# Patient Record
Sex: Female | Born: 1960 | Race: White | Hispanic: No | Marital: Single | Smoking: Former smoker
Health system: Southern US, Community
[De-identification: ages and names within clinical notes are randomized; demographics above are authoritative.]

## PROBLEM LIST (undated history)

## (undated) DIAGNOSIS — T7840XA Allergy, unspecified, initial encounter: Secondary | ICD-10-CM

## (undated) DIAGNOSIS — B1920 Unspecified viral hepatitis C without hepatic coma: Secondary | ICD-10-CM

## (undated) DIAGNOSIS — K219 Gastro-esophageal reflux disease without esophagitis: Secondary | ICD-10-CM

## (undated) DIAGNOSIS — M1990 Unspecified osteoarthritis, unspecified site: Secondary | ICD-10-CM

## (undated) DIAGNOSIS — J449 Chronic obstructive pulmonary disease, unspecified: Secondary | ICD-10-CM

## (undated) HISTORY — PX: BACK SURGERY: SHX140

## (undated) HISTORY — DX: Gastro-esophageal reflux disease without esophagitis: K21.9

## (undated) HISTORY — PX: SPINE SURGERY: SHX786

## (undated) HISTORY — PX: ABDOMINAL HYSTERECTOMY: SHX81

## (undated) HISTORY — DX: Unspecified osteoarthritis, unspecified site: M19.90

## (undated) HISTORY — DX: Allergy, unspecified, initial encounter: T78.40XA

---

## 2010-07-13 ENCOUNTER — Emergency Department: Payer: Self-pay | Admitting: Unknown Physician Specialty

## 2013-08-30 ENCOUNTER — Ambulatory Visit: Payer: Self-pay | Admitting: Urology

## 2013-09-11 ENCOUNTER — Ambulatory Visit: Payer: Self-pay | Admitting: Obstetrics and Gynecology

## 2013-09-12 ENCOUNTER — Ambulatory Visit: Payer: Self-pay | Admitting: Urology

## 2013-11-05 ENCOUNTER — Ambulatory Visit: Payer: Self-pay | Admitting: Gastroenterology

## 2013-11-06 LAB — PATHOLOGY REPORT

## 2016-01-26 ENCOUNTER — Other Ambulatory Visit: Payer: Self-pay | Admitting: Physician Assistant

## 2016-01-26 ENCOUNTER — Ambulatory Visit
Admission: RE | Admit: 2016-01-26 | Discharge: 2016-01-26 | Disposition: A | Discharge status: Home / Self Care | Payer: BLUE CROSS/BLUE SHIELD | Source: Ambulatory Visit | Attending: Family Medicine | Admitting: Family Medicine

## 2016-01-26 ENCOUNTER — Other Ambulatory Visit: Payer: Self-pay | Admitting: Family Medicine

## 2016-01-26 DIAGNOSIS — K5730 Diverticulosis of large intestine without perforation or abscess without bleeding: Secondary | ICD-10-CM | POA: Diagnosis not present

## 2016-01-26 DIAGNOSIS — M48061 Spinal stenosis, lumbar region without neurogenic claudication: Secondary | ICD-10-CM | POA: Diagnosis not present

## 2016-01-26 DIAGNOSIS — M545 Low back pain: Secondary | ICD-10-CM

## 2016-01-26 DIAGNOSIS — R102 Pelvic and perineal pain: Secondary | ICD-10-CM | POA: Insufficient documentation

## 2016-01-26 DIAGNOSIS — M5126 Other intervertebral disc displacement, lumbar region: Secondary | ICD-10-CM | POA: Insufficient documentation

## 2016-01-26 DIAGNOSIS — M5136 Other intervertebral disc degeneration, lumbar region: Secondary | ICD-10-CM | POA: Insufficient documentation

## 2016-01-28 ENCOUNTER — Other Ambulatory Visit: Payer: Self-pay | Admitting: Specialist

## 2016-01-28 DIAGNOSIS — R911 Solitary pulmonary nodule: Secondary | ICD-10-CM

## 2016-01-28 DIAGNOSIS — R0602 Shortness of breath: Secondary | ICD-10-CM

## 2016-02-09 ENCOUNTER — Encounter
Admission: RE | Admit: 2016-02-09 | Discharge: 2016-02-09 | Disposition: A | Discharge status: Home / Self Care | Payer: BLUE CROSS/BLUE SHIELD | Source: Ambulatory Visit | Attending: Specialist | Admitting: Specialist

## 2016-02-09 DIAGNOSIS — R911 Solitary pulmonary nodule: Secondary | ICD-10-CM | POA: Diagnosis present

## 2016-02-09 DIAGNOSIS — R0602 Shortness of breath: Secondary | ICD-10-CM

## 2016-02-09 LAB — GLUCOSE, CAPILLARY: Glucose-Capillary: 89 mg/dL (ref 65–99)

## 2016-02-09 MED ORDER — FLUDEOXYGLUCOSE F - 18 (FDG) INJECTION
12.9300 | Freq: Once | INTRAVENOUS | Status: AC | PRN
  Administered 2016-02-09: 12.93 via INTRAVENOUS

## 2017-08-08 ENCOUNTER — Emergency Department
Admission: EM | Admit: 2017-08-08 | Discharge: 2017-08-08 | Disposition: A | Discharge status: Home / Self Care | Payer: BLUE CROSS/BLUE SHIELD | Attending: Student in an Organized Health Care Education/Training Program | Admitting: Student in an Organized Health Care Education/Training Program

## 2017-08-08 ENCOUNTER — Encounter: Payer: Self-pay | Admitting: Emergency Medicine

## 2017-08-08 ENCOUNTER — Emergency Department: Payer: BLUE CROSS/BLUE SHIELD

## 2017-08-08 DIAGNOSIS — Z87891 Personal history of nicotine dependence: Secondary | ICD-10-CM | POA: Diagnosis not present

## 2017-08-08 DIAGNOSIS — Y9389 Activity, other specified: Secondary | ICD-10-CM | POA: Diagnosis not present

## 2017-08-08 DIAGNOSIS — R519 Headache, unspecified: Secondary | ICD-10-CM

## 2017-08-08 DIAGNOSIS — Y92017 Garden or yard in single-family (private) house as the place of occurrence of the external cause: Secondary | ICD-10-CM | POA: Diagnosis not present

## 2017-08-08 DIAGNOSIS — S20369A Insect bite (nonvenomous) of unspecified front wall of thorax, initial encounter: Secondary | ICD-10-CM | POA: Insufficient documentation

## 2017-08-08 DIAGNOSIS — R51 Headache: Secondary | ICD-10-CM | POA: Diagnosis not present

## 2017-08-08 DIAGNOSIS — W57XXXA Bitten or stung by nonvenomous insect and other nonvenomous arthropods, initial encounter: Secondary | ICD-10-CM | POA: Insufficient documentation

## 2017-08-08 DIAGNOSIS — Y998 Other external cause status: Secondary | ICD-10-CM | POA: Diagnosis not present

## 2017-08-08 LAB — BASIC METABOLIC PANEL
ANION GAP: 8 (ref 5–15)
BUN: 15 mg/dL (ref 6–20)
CHLORIDE: 106 mmol/L (ref 101–111)
CO2: 25 mmol/L (ref 22–32)
Calcium: 8.9 mg/dL (ref 8.9–10.3)
Creatinine, Ser: 0.4 mg/dL — ABNORMAL LOW (ref 0.44–1.00)
GFR calc Af Amer: >60 mL/min (ref >60)
Glucose, Bld: 100 mg/dL — ABNORMAL HIGH (ref 65–99)
POTASSIUM: 3.7 mmol/L (ref 3.5–5.1)
Sodium: 139 mmol/L (ref 135–145)

## 2017-08-08 LAB — CBC
HEMATOCRIT: 42.2 % (ref 35.0–47.0)
HEMOGLOBIN: 14.6 g/dL (ref 12.0–16.0)
MCH: 31.8 pg (ref 26.0–34.0)
MCHC: 34.6 g/dL (ref 32.0–36.0)
MCV: 92 fL (ref 80.0–100.0)
Platelets: 242 10*3/uL (ref 150–440)
RBC: 4.59 MIL/uL (ref 3.80–5.20)
RDW: 13.9 % (ref 11.5–14.5)
WBC: 8.5 10*3/uL (ref 3.6–11.0)

## 2017-08-08 LAB — URINALYSIS, COMPLETE (UACMP) WITH MICROSCOPIC
Bilirubin Urine: NEGATIVE
Glucose, UA: NEGATIVE mg/dL
KETONES UR: NEGATIVE mg/dL
LEUKOCYTES UA: NEGATIVE
NITRITE: NEGATIVE
PH: 5 (ref 5.0–8.0)
Protein, ur: NEGATIVE mg/dL
SPECIFIC GRAVITY, URINE: 1.019 (ref 1.005–1.030)

## 2017-08-08 MED ORDER — PROCHLORPERAZINE EDISYLATE 10 MG/2ML IJ SOLN
10.0000 mg | Freq: Once | INTRAMUSCULAR | Status: AC
  Administered 2017-08-08: 10 mg via INTRAVENOUS
  Filled 2017-08-08: qty 2

## 2017-08-08 MED ORDER — DEXAMETHASONE SODIUM PHOSPHATE 10 MG/ML IJ SOLN
10.0000 mg | Freq: Once | INTRAMUSCULAR | Status: AC
  Administered 2017-08-08: 10 mg via INTRAVENOUS
  Filled 2017-08-08: qty 1

## 2017-08-08 MED ORDER — ACETAMINOPHEN 500 MG PO TABS
1000.0000 mg | ORAL_TABLET | Freq: Once | ORAL | Status: AC
  Administered 2017-08-08: 1000 mg via ORAL
  Filled 2017-08-08: qty 2

## 2017-08-08 NOTE — ED Provider Notes (Signed)
[facility] Emergency Department Provider Note    First MD Initiated Contact with Patient 08/08/17 1501     (approximate)  I have reviewed the triage vital signs and the nursing notes.   HISTORY  Chief Complaint Headache    HPI Crystal Villa is a 57 y.o. female with a history of headaches presents to the ER for evaluation of headache that she rates as 8 out of 10 in severity since Saturday night.  States that the headache started after the patient was out in the yard gardening and then got bit by some form of bug on her upper abdomen with several itching bites.  States that the itching has been driving her crazy and causing her head to feel like it is about to pop off of her shoulders.  Denies any numbness or tingling.  States that it is harder for her to focus on things that are far away.  Denies any visual field cuts.  No nausea or vomiting.  No fevers.  No neck pain.  Has not tried any Benadryl but did take a BC Goody's powder this morning.  History reviewed. No pertinent past medical history. No family history on file.  There are no active problems to display for this patient.     Prior to Admission medications   Not on File    Allergies Patient has no known allergies.    Social History Social History   Tobacco Use  . Smoking status: Former Smoker  Substance Use Topics  . Alcohol use: Not on file  . Drug use: Not on file    Review of Systems Patient denies headaches, rhinorrhea, blurry vision, numbness, shortness of breath, chest pain, edema, cough, abdominal pain, nausea, vomiting, diarrhea, dysuria, fevers, rashes or hallucinations unless otherwise stated above in HPI. ____________________________________________   PHYSICAL EXAM:  VITAL SIGNS: Vitals:   08/08/17 1221 08/08/17 1523  BP: (!) 137/97 133/73  Pulse: 76 72  Resp: 17 18  Temp: 97.8 F (36.6 C) 97.6 F (36.4 C)  SpO2: 97% 100%    Constitutional: Alert  and oriented. Well appearing and in no acute distress. Eyes: Conjunctivae are normal.  Head: Atraumatic. Nose: No congestion/rhinnorhea. Mouth/Throat: Mucous membranes are moist.   Neck: No stridor. Painless ROM.  Cardiovascular: Normal rate, regular rhythm. Grossly normal heart sounds.  Good peripheral circulation. Respiratory: Normal respiratory effort.  No retractions. Lungs CTAB. Gastrointestinal: Soft and nontender. No distention. No abdominal bruits. No CVA tenderness. Genitourinary:  Musculoskeletal: No lower extremity tenderness nor edema.  No joint effusions. Neurologic:  CN- intact.  No facial droop, Normal FNF.  Normal heel to shin.  Sensation intact bilaterally. Normal speech and language. No gross focal neurologic deficits are appreciated. No gait instability. Skin:  Skin is warm, dry and intact.3 bug bits just uner bra line, no surrounding erythema or purulence Psychiatric: Mood and affect are normal. Speech and behavior are normal.  ____________________________________________   LABS (all labs ordered are listed, but only abnormal results are displayed)  Results for orders placed or performed during the hospital encounter of 08/08/17 (from the past 24 hour(s))  Basic metabolic panel     Status: Abnormal   Collection Time: 08/08/17 12:39 PM  Result Value Ref Range   Sodium 139 135 - 145 mmol/L   Potassium 3.7 3.5 - 5.1 mmol/L   Chloride 106 101 - 111 mmol/L   CO2 25 22 - 32 mmol/L   Glucose, Bld 100 (H) 65 - 99 mg/dL  BUN 15 6 - 20 mg/dL   Creatinine, Ser 7.820.40 (L) 0.44 - 1.00 mg/dL   Calcium 8.9 8.9 - 95.610.3 mg/dL   GFR calc non Af Amer >60 >60 mL/min   GFR calc Af Amer >60 >60 mL/min   Anion gap 8 5 - 15  CBC     Status: None   Collection Time: 08/08/17 12:39 PM  Result Value Ref Range   WBC 8.5 3.6 - 11.0 K/uL   RBC 4.59 3.80 - 5.20 MIL/uL   Hemoglobin 14.6 12.0 - 16.0 g/dL   HCT 21.342.2 08.635.0 - 57.847.0 %   MCV 92.0 80.0 - 100.0 fL   MCH 31.8 26.0 - 34.0 pg   MCHC  34.6 32.0 - 36.0 g/dL   RDW 46.913.9 62.911.5 - 52.814.5 %   Platelets 242 150 - 440 K/uL  Urinalysis, Complete w Microscopic     Status: Abnormal   Collection Time: 08/08/17 12:39 PM  Result Value Ref Range   Color, Urine YELLOW (A) YELLOW   APPearance CLEAR (A) CLEAR   Specific Gravity, Urine 1.019 1.005 - 1.030   pH 5.0 5.0 - 8.0   Glucose, UA NEGATIVE NEGATIVE mg/dL   Hgb urine dipstick SMALL (A) NEGATIVE   Bilirubin Urine NEGATIVE NEGATIVE   Ketones, ur NEGATIVE NEGATIVE mg/dL   Protein, ur NEGATIVE NEGATIVE mg/dL   Nitrite NEGATIVE NEGATIVE   Leukocytes, UA NEGATIVE NEGATIVE   RBC / HPF 0-5 0 - 5 RBC/hpf   WBC, UA 0-5 0 - 5 WBC/hpf   Squamous Epithelial / LPF 0-5 (A) NONE SEEN   Mucus PRESENT    ____________________________________________  EKG My review and personal interpretation at Time: 12:28   Indication: headache  Rate: 70  Rhythm: sinus Axis: normal Other: normal intervals, no stemio ____________________________________________  RADIOLOGY  I personally reviewed all radiographic images ordered to evaluate for the above acute complaints and reviewed radiology reports and findings.  These findings were personally discussed with the patient.  Please see medical record for radiology report.  ____________________________________________   PROCEDURES  Procedure(s) performed:  Procedures    Critical Care performed: no ____________________________________________   INITIAL IMPRESSION / ASSESSMENT AND PLAN / ED COURSE  Pertinent labs & imaging results that were available during my care of the patient were reviewed by me and considered in my medical decision making (see chart for details).  DDX: tension, migraine, cluster, unlikely sah, iph, gca  Crystal Villa is a 57 y.o. who presents to the ED with symptoms as described above.  No focal neuro deficits.  CT head read triage shows no evidence of mass or bleed.  This not clinically consistent with subarachnoid.   She has no evidence of meningeal irritation and symptoms seem mild.  Symptoms seem predominantly exacerbated by the itching in her chest therefore I do suspect some component of tension headache.  Not clinically consistent with cluster.  Blood work otherwise reassuring.  Will give IV migraine cocktail and reassess.  Clinical Course as of Aug 09 1627  Mon Aug 08, 2017  1626 Patient reassessed and is currently pain-free.  Itching has resolved.  This point patient stable and appropriate for outpatient follow-up.   [PR]    Clinical Course User Index [PR] Willy Eddyobinson, Vasil Juhasz, MD     As part of my medical decision making, I reviewed the following data within the electronic MEDICAL RECORD NUMBER Nursing notes reviewed and incorporated, Labs reviewed, notes from prior ED visits and [place] Controlled Substance Database   ____________________________________________  FINAL CLINICAL IMPRESSION(S) / ED DIAGNOSES  Final diagnoses:  Bad headache  Insect bite of front wall of thorax, unspecified laterality, initial encounter      NEW MEDICATIONS STARTED DURING THIS VISIT:  New Prescriptions   No medications on file     Note:  This document was prepared using Dragon voice recognition software and may include unintentional dictation errors.    Willy Eddy, MD 08/08/17 [phone]

## 2017-08-08 NOTE — Discharge Instructions (Addendum)

## 2017-08-08 NOTE — ED Triage Notes (Signed)
[facility]/o headache 8/10 pain since Saturday night. Reports vision disturbances, things out of focus and feeling anxious. Also reports bug bites to upper abdomen that are itchy.

## 2019-01-23 ENCOUNTER — Other Ambulatory Visit: Payer: Self-pay | Admitting: *Deleted

## 2019-01-23 DIAGNOSIS — Z20822 Contact with and (suspected) exposure to covid-19: Secondary | ICD-10-CM

## 2019-01-25 LAB — NOVEL CORONAVIRUS, NAA: SARS-CoV-2, NAA: NOT DETECTED

## 2019-10-02 ENCOUNTER — Ambulatory Visit
Admission: EM | Admit: 2019-10-02 | Discharge: 2019-10-02 | Disposition: A | Discharge status: Home / Self Care | Payer: BC Managed Care – PPO | Attending: Emergency Medicine | Admitting: Emergency Medicine

## 2019-10-02 ENCOUNTER — Encounter: Payer: Self-pay | Admitting: Emergency Medicine

## 2019-10-02 DIAGNOSIS — R05 Cough: Secondary | ICD-10-CM

## 2019-10-02 DIAGNOSIS — R059 Cough, unspecified: Secondary | ICD-10-CM

## 2019-10-02 DIAGNOSIS — Z1152 Encounter for screening for COVID-19: Secondary | ICD-10-CM

## 2019-10-02 HISTORY — DX: Chronic obstructive pulmonary disease, unspecified: J44.9

## 2019-10-02 MED ORDER — FLUTICASONE PROPIONATE 50 MCG/ACT NA SUSP: 1.0000 | Freq: Every day | NASAL | 0 refills | Status: DC

## 2019-10-02 MED ORDER — BENZONATATE 100 MG PO CAPS: 100.0000 mg | ORAL_CAPSULE | Freq: Three times a day (TID) | ORAL | 0 refills | Status: DC

## 2019-10-02 MED ORDER — PREDNISONE 10 MG PO TABS: 20.0000 mg | ORAL_TABLET | Freq: Every day | ORAL | 0 refills | Status: DC

## 2019-10-02 MED ORDER — CETIRIZINE HCL 10 MG PO TABS: 10.0000 mg | ORAL_TABLET | Freq: Every day | ORAL | 0 refills | Status: DC

## 2019-10-02 NOTE — ED Triage Notes (Addendum)
Sinus pressure, headache, congestion and cough since yesterday.  Needs a work note, was unable to go in today.

## 2019-10-02 NOTE — ED Provider Notes (Signed)
[facility] CARE CENTER   [id number] 10/02/19 Arrival Time: 0810   Chief Complaint  Patient presents with  . Cough    SUBJECTIVE: History from: patient.  Crystal Villa is a 59 y.o. female with history of COPD presents to the urgent care with a complaint of sinus pressure, congestion, cough and headache for the past 1 day.  Denies sick exposure to COVID, flu or strep.  Denies recent travel.  Has tried  albuterol without relief.  Symptoms are made worse in the morning.  Denies previous symptoms in the past.   Denies fever, chills, fatigue, sinus pain, rhinorrhea, sore throat, SOB, wheezing, chest pain, nausea, changes in bowel or bladder habits.        Received flu shot this year: no.  ROS: As per HPI.  All other pertinent ROS negative.     Past Medical History:  Diagnosis Date  . COPD (chronic obstructive pulmonary disease) (HCC)    Past Surgical History:  Procedure Laterality Date  . ABDOMINAL HYSTERECTOMY    . BACK SURGERY     No Known Allergies No current facility-administered medications on file prior to encounter.   No current outpatient medications on file prior to encounter.   Social History   Socioeconomic History  . Marital status: Single    Spouse name: Not on file  . Number of children: Not on file  . Years of education: Not on file  . Highest education level: Not on file  Occupational History  . Not on file  Tobacco Use  . Smoking status: Former Games developer  . Smokeless tobacco: Never Used  Substance and Sexual Activity  . Alcohol use: Never  . Drug use: Never  . Sexual activity: Not on file  Other Topics Concern  . Not on file  Social History Narrative  . Not on file   Social Determinants of Health   Financial Resource Strain:   . Difficulty of Paying Living Expenses:   Food Insecurity:   . Worried About Programme researcher, broadcasting/film/video in the Last Year:   . Barista in the Last Year:   Transportation Needs:   . Freight forwarder  (Medical):   Marland Kitchen Lack of Transportation (Non-Medical):   Physical Activity:   . Days of Exercise per Week:   . Minutes of Exercise per Session:   Stress:   . Feeling of Stress :   Social Connections:   . Frequency of Communication with Friends and Family:   . Frequency of Social Gatherings with Friends and Family:   . Attends Religious Services:   . Active Member of Clubs or Organizations:   . Attends Banker Meetings:   Marland Kitchen Marital Status:   Intimate Partner Violence:   . Fear of Current or Ex-Partner:   . Emotionally Abused:   Marland Kitchen Physically Abused:   . Sexually Abused:    Family History  Problem Relation Age of Onset  . Healthy Mother   . Hyperlipidemia Father     OBJECTIVE:  Vitals:   10/02/19 0821  BP: 122/80  Pulse: 93  Resp: 18  Temp: 98.3 F (36.8 C)  TempSrc: Oral  SpO2: 98%  Weight: 150 lb (68 kg)  Height: 5\' 3"  (1.6 m)     Physical Exam Vitals and nursing note reviewed.  Constitutional:      General: She is not in acute distress.    Appearance: Normal appearance. She is normal weight. She is not ill-appearing, toxic-appearing or diaphoretic.  HENT:  Head: Normocephalic.     Right Ear: Tympanic membrane, ear canal and external ear normal. There is no impacted cerumen.     Left Ear: Tympanic membrane, ear canal and external ear normal. There is no impacted cerumen.     Nose: Nose normal. No congestion or rhinorrhea.     Mouth/Throat:     Mouth: Mucous membranes are moist.     Pharynx: Oropharynx is clear. No oropharyngeal exudate or posterior oropharyngeal erythema.  Cardiovascular:     Rate and Rhythm: Normal rate and regular rhythm.     Pulses: Normal pulses.     Heart sounds: Normal heart sounds. No murmur heard.  No friction rub. No gallop.   Pulmonary:     Effort: Pulmonary effort is normal. No respiratory distress.     Breath sounds: Normal breath sounds. No stridor. No wheezing, rhonchi or rales.  Chest:     Chest wall: No  tenderness.  Skin:    Capillary Refill: Capillary refill takes less than 2 seconds.  Neurological:     General: No focal deficit present.     Mental Status: She is alert and oriented to person, place, and time.     LABS:  No results found for this or any previous visit (from the past 24 hour(s)).   ASSESSMENT & PLAN:  1. Cough   2. Encounter for screening for COVID-19     Meds ordered this encounter  Medications  . benzonatate (TESSALON) 100 MG capsule    Sig: Take 1 capsule (100 mg total) by mouth every 8 (eight) hours.    Dispense:  30 capsule    Refill:  0  . fluticasone (FLONASE) 50 MCG/ACT nasal spray    Sig: Place 1 spray into both nostrils daily for 14 days.    Dispense:  16 g    Refill:  0  . cetirizine (ZYRTEC ALLERGY) 10 MG tablet    Sig: Take 1 tablet (10 mg total) by mouth daily.    Dispense:  30 tablet    Refill:  0  . predniSONE (DELTASONE) 10 MG tablet    Sig: Take 2 tablets (20 mg total) by mouth daily.    Dispense:  15 tablet    Refill:  0   Discharge instructions    COVID testing ordered.  It will take between 2-7 days for test results.  Someone will contact you regarding abnormal results.    In the meantime: You should remain isolated in your home for 10 days from symptom onset AND greater than 24 hours after symptoms resolution (absence of fever without the use of fever-reducing medication and improvement in respiratory symptoms), whichever is longer Get plenty of rest and push fluids Tessalon Perles prescribed for cough  zyrtec for nasal congestion, runny nose, and/or sore throat  flonase for nasal congestion and runny nose Prednisone was prescribed Use medications daily for symptom relief Use OTC medications like ibuprofen or tylenol as needed fever or pain Call or go to the ED if you have any new or worsening symptoms such as fever, worsening cough, shortness of breath, chest tightness, chest pain, turning blue, changes in mental status,  etc...   Reviewed expectations re: course of current medical issues. Questions answered. Outlined signs and symptoms indicating need for more acute intervention. Patient verbalized understanding. After Visit Summary given.     Note: This document was prepared using Dragon voice recognition software and may include unintentional dictation errors.       Pilar Grammes  S, FNP 10/02/19 [phone]

## 2019-10-02 NOTE — Discharge Instructions (Addendum)
COVID testing ordered.  It will take between 2-7 days for test results.  Someone will contact you regarding abnormal results.    In the meantime: You should remain isolated in your home for 10 days from symptom onset AND greater than 24 hours after symptoms resolution (absence of fever without the use of fever-reducing medication and improvement in respiratory symptoms), whichever is longer Get plenty of rest and push fluids Tessalon Perles prescribed for cough  zyrtec for nasal congestion, runny nose, and/or sore throat  flonase for nasal congestion and runny nose Prednisone was prescribed Use medications daily for symptom relief Use OTC medications like ibuprofen or tylenol as needed fever or pain Call or go to the ED if you have any new or worsening symptoms such as fever, worsening cough, shortness of breath, chest tightness, chest pain, turning blue, changes in mental status, etc..Marland Kitchen

## 2019-10-03 LAB — SARS-COV-2, NAA 2 DAY TAT

## 2019-10-03 LAB — NOVEL CORONAVIRUS, NAA: SARS-CoV-2, NAA: NOT DETECTED

## 2020-01-03 ENCOUNTER — Ambulatory Visit
Admission: EM | Admit: 2020-01-03 | Discharge: 2020-01-03 | Disposition: A | Discharge status: Home / Self Care | Payer: Self-pay | Attending: Emergency Medicine | Admitting: Emergency Medicine

## 2020-01-03 ENCOUNTER — Encounter: Payer: Self-pay | Admitting: Emergency Medicine

## 2020-01-03 ENCOUNTER — Other Ambulatory Visit: Payer: Self-pay

## 2020-01-03 DIAGNOSIS — Z1152 Encounter for screening for COVID-19: Secondary | ICD-10-CM

## 2020-01-03 DIAGNOSIS — J069 Acute upper respiratory infection, unspecified: Secondary | ICD-10-CM

## 2020-01-03 DIAGNOSIS — A084 Viral intestinal infection, unspecified: Secondary | ICD-10-CM

## 2020-01-03 MED ORDER — BENZONATATE 100 MG PO CAPS: 100.0000 mg | ORAL_CAPSULE | Freq: Three times a day (TID) | ORAL | 0 refills | Status: DC

## 2020-01-03 MED ORDER — DEXAMETHASONE 4 MG PO TABS: 4.0000 mg | ORAL_TABLET | Freq: Two times a day (BID) | ORAL | 0 refills | Status: AC

## 2020-01-03 MED ORDER — ONDANSETRON 4 MG PO TBDP
4.0000 mg | ORAL_TABLET | Freq: Once | ORAL | Status: AC
  Administered 2020-01-03: 4 mg via ORAL

## 2020-01-03 MED ORDER — ONDANSETRON 4 MG PO TBDP: 4.0000 mg | ORAL_TABLET | Freq: Three times a day (TID) | ORAL | 0 refills | Status: DC | PRN

## 2020-01-03 MED ORDER — FLUTICASONE PROPIONATE 50 MCG/ACT NA SUSP: 1.0000 | Freq: Every day | NASAL | 0 refills | Status: DC

## 2020-01-03 NOTE — ED Provider Notes (Signed)
[facility] CARE CENTER   [id number] 01/03/20 Arrival Time: 0807   CC: COVID symptoms  SUBJECTIVE: History from: patient.  Crystal Villa is a 59 y.o. female who presents the urgent care for complaint of fatigue, cough, nausea, vomiting and diarrhea started this past Monday.  Denies sick exposure to COVID, flu or strep.  Denies recent travel.  Has tried OTC medication with no relief.  Denies aggravating factors.  Denies previous symptoms in the past.   Denies fever, chills, fatigue, sinus pain, rhinorrhea, sore throat, SOB, wheezing, chest pain, changes in bowel or bladder habits.     ROS: As per HPI.  All other pertinent ROS negative.      Past Medical History:  Diagnosis Date  . COPD (chronic obstructive pulmonary disease) (HCC)    Past Surgical History:  Procedure Laterality Date  . ABDOMINAL HYSTERECTOMY    . BACK SURGERY     No Known Allergies No current facility-administered medications on file prior to encounter.   Current Outpatient Medications on File Prior to Encounter  Medication Sig Dispense Refill  . cetirizine (ZYRTEC ALLERGY) 10 MG tablet Take 1 tablet (10 mg total) by mouth daily. 30 tablet 0  . predniSONE (DELTASONE) 10 MG tablet Take 2 tablets (20 mg total) by mouth daily. 15 tablet 0   Social History   Socioeconomic History  . Marital status: Single    Spouse name: Not on file  . Number of children: Not on file  . Years of education: Not on file  . Highest education level: Not on file  Occupational History  . Not on file  Tobacco Use  . Smoking status: Former Games developer  . Smokeless tobacco: Never Used  Substance and Sexual Activity  . Alcohol use: Never  . Drug use: Never  . Sexual activity: Not on file  Other Topics Concern  . Not on file  Social History Narrative  . Not on file   Social Determinants of Health   Financial Resource Strain:   . Difficulty of Paying Living Expenses: Not on file  Food Insecurity:   . Worried About  Programme researcher, broadcasting/film/video in the Last Year: Not on file  . Ran Out of Food in the Last Year: Not on file  Transportation Needs:   . Lack of Transportation (Medical): Not on file  . Lack of Transportation (Non-Medical): Not on file  Physical Activity:   . Days of Exercise per Week: Not on file  . Minutes of Exercise per Session: Not on file  Stress:   . Feeling of Stress : Not on file  Social Connections:   . Frequency of Communication with Friends and Family: Not on file  . Frequency of Social Gatherings with Friends and Family: Not on file  . Attends Religious Services: Not on file  . Active Member of Clubs or Organizations: Not on file  . Attends Banker Meetings: Not on file  . Marital Status: Not on file  Intimate Partner Violence:   . Fear of Current or Ex-Partner: Not on file  . Emotionally Abused: Not on file  . Physically Abused: Not on file  . Sexually Abused: Not on file   Family History  Problem Relation Age of Onset  . Healthy Mother   . Hyperlipidemia Father     OBJECTIVE:  Vitals:   01/03/20 0824  BP: (!) 129/91  Pulse: 83  Resp: 18  Temp: 98 F (36.7 C)  TempSrc: Oral  SpO2: 98%  Weight: 150  lb (68 kg)  Height: 5\' 4"  (1.626 m)     General appearance: alert; appears fatigued, but nontoxic; speaking in full sentences and tolerating own secretions HEENT: NCAT; Ears: EACs clear, TMs pearly gray; Eyes: PERRL.  EOM grossly intact. Sinuses: nontender; Nose: nares patent without rhinorrhea, Throat: oropharynx clear, tonsils non erythematous or enlarged, uvula midline  Neck: supple without LAD Lungs: unlabored respirations, symmetrical air entry; cough: moderate; no respiratory distress; CTAB Heart: regular rate and rhythm.  Radial pulses 2+ symmetrical bilaterally Skin: warm and dry Psychological: alert and cooperative; normal mood and affect  LABS:  No results found for this or any previous visit (from the past 24 hour(s)).   ASSESSMENT &  PLAN:  1. Viral URI   2. Viral gastroenteritis   3. Encounter for screening for COVID-19     Meds ordered this encounter  Medications  . ondansetron (ZOFRAN-ODT) disintegrating tablet 4 mg  . ondansetron (ZOFRAN ODT) 4 MG disintegrating tablet    Sig: Take 1 tablet (4 mg total) by mouth every 8 (eight) hours as needed for nausea or vomiting.    Dispense:  20 tablet    Refill:  0  . benzonatate (TESSALON) 100 MG capsule    Sig: Take 1 capsule (100 mg total) by mouth every 8 (eight) hours.    Dispense:  30 capsule    Refill:  0  . fluticasone (FLONASE) 50 MCG/ACT nasal spray    Sig: Place 1 spray into both nostrils daily for 14 days.    Dispense:  16 g    Refill:  0  . dexamethasone (DECADRON) 4 MG tablet    Sig: Take 1 tablet (4 mg total) by mouth 2 (two) times daily with a meal for 7 days.    Dispense:  14 tablet    Refill:  0     COVID testing ordered.  It will take between 2-7 days for test results.  Someone will contact you regarding abnormal results.    In the meantime: You should remain isolated in your home for 10 days from symptom onset AND greater than 24 hours after symptoms resolution (absence of fever without the use of fever-reducing medication and improvement in respiratory symptoms), whichever is longer Get plenty of rest and push fluids Tessalon Perles prescribed for cough Zofran prescribed for nausea Flonase for nasal congestion and runny nose Decadron was prescribed Use medications daily for symptom relief Use OTC medications like ibuprofen or tylenol as needed fever or pain Call or go to the ED if you have any new or worsening symptoms such as fever, worsening cough, shortness of breath, chest tightness, chest pain, turning blue, changes in mental status, etc...   Reviewed expectations re: course of current medical issues. Questions answered. Outlined signs and symptoms indicating need for more acute intervention. Patient verbalized understanding. After  Visit Summary given.         , FNP 01/03/20 [phone]

## 2020-01-03 NOTE — Discharge Instructions (Signed)
COVID testing ordered.  It will take between 2-7 days for test results.  Someone will contact you regarding abnormal results.    In the meantime: You should remain isolated in your home for 10 days from symptom onset AND greater than 24 hours after symptoms resolution (absence of fever without the use of fever-reducing medication and improvement in respiratory symptoms), whichever is longer Get plenty of rest and push fluids Tessalon Perles prescribed for cough Zofran prescribed for nausea Flonase for nasal congestion and runny nose Decadron was prescribed Use medications daily for symptom relief Use OTC medications like ibuprofen or tylenol as needed fever or pain Call or go to the ED if you have any new or worsening symptoms such as fever, worsening cough, shortness of breath, chest tightness, chest pain, turning blue, changes in mental status, etc..Marland Kitchen

## 2020-01-03 NOTE — ED Triage Notes (Signed)
Cough, N/V/D and fatigue since Monday

## 2020-01-05 LAB — NOVEL CORONAVIRUS, NAA: SARS-CoV-2, NAA: DETECTED — AB

## 2020-01-05 LAB — SARS-COV-2, NAA 2 DAY TAT

## 2020-01-14 ENCOUNTER — Encounter: Payer: Self-pay | Admitting: Emergency Medicine

## 2020-01-14 ENCOUNTER — Ambulatory Visit
Admission: EM | Admit: 2020-01-14 | Discharge: 2020-01-14 | Disposition: A | Discharge status: Home / Self Care | Payer: HRSA Program | Attending: Emergency Medicine | Admitting: Emergency Medicine

## 2020-01-14 DIAGNOSIS — B3781 Candidal esophagitis: Secondary | ICD-10-CM

## 2020-01-14 DIAGNOSIS — U071 COVID-19: Secondary | ICD-10-CM

## 2020-01-14 DIAGNOSIS — R070 Pain in throat: Secondary | ICD-10-CM

## 2020-01-14 MED ORDER — NYSTATIN 100000 UNIT/ML MT SUSP: 500000.0000 [IU] | Freq: Four times a day (QID) | OROMUCOSAL | 0 refills | Status: DC

## 2020-01-14 NOTE — ED Provider Notes (Signed)
[facility]   [id number] 01/14/20 Arrival Time: 1217   CC: COVID symptoms  SUBJECTIVE: History from: patient.  Crystal Villa is a 59 y.o. female who presents for persistent fatigued and "not feeling good" after covid diagnosis on 01/03/20.  Also report throat irritation x 1 day.  Treated with prednisone for covid infection.  Has tried OTC medications without relief.  Symptoms are made worse with swallowing, but tolerating own secretions without difficulty.  Denies previous symptoms in the past.   Denies fever, chills, sinus pain, rhinorrhea, SOB, wheezing, chest pain, nausea, changes in bowel or bladder habits.    ROS: As per HPI.  All other pertinent ROS negative.     Past Medical History:  Diagnosis Date  . COPD (chronic obstructive pulmonary disease) (HCC)    Past Surgical History:  Procedure Laterality Date  . ABDOMINAL HYSTERECTOMY    . BACK SURGERY     No Known Allergies No current facility-administered medications on file prior to encounter.   Current Outpatient Medications on File Prior to Encounter  Medication Sig Dispense Refill  . [DISCONTINUED] cetirizine (ZYRTEC ALLERGY) 10 MG tablet Take 1 tablet (10 mg total) by mouth daily. 30 tablet 0  . [DISCONTINUED] fluticasone (FLONASE) 50 MCG/ACT nasal spray Place 1 spray into both nostrils daily for 14 days. 16 g 0   Social History   Socioeconomic History  . Marital status: Single    Spouse name: Not on file  . Number of children: Not on file  . Years of education: Not on file  . Highest education level: Not on file  Occupational History  . Not on file  Tobacco Use  . Smoking status: Former Games developer  . Smokeless tobacco: Never Used  Substance and Sexual Activity  . Alcohol use: Never  . Drug use: Never  . Sexual activity: Not on file  Other Topics Concern  . Not on file  Social History Narrative  . Not on file   Social Determinants of Health   Financial Resource Strain:   . Difficulty  of Paying Living Expenses: Not on file  Food Insecurity:   . Worried About Programme researcher, broadcasting/film/video in the Last Year: Not on file  . Ran Out of Food in the Last Year: Not on file  Transportation Needs:   . Lack of Transportation (Medical): Not on file  . Lack of Transportation (Non-Medical): Not on file  Physical Activity:   . Days of Exercise per Week: Not on file  . Minutes of Exercise per Session: Not on file  Stress:   . Feeling of Stress : Not on file  Social Connections:   . Frequency of Communication with Friends and Family: Not on file  . Frequency of Social Gatherings with Friends and Family: Not on file  . Attends Religious Services: Not on file  . Active Member of Clubs or Organizations: Not on file  . Attends Banker Meetings: Not on file  . Marital Status: Not on file  Intimate Partner Violence:   . Fear of Current or Ex-Partner: Not on file  . Emotionally Abused: Not on file  . Physically Abused: Not on file  . Sexually Abused: Not on file   Family History  Problem Relation Age of Onset  . Healthy Mother   . Hyperlipidemia Father     OBJECTIVE:  Vitals:   01/14/20 1323  BP: 131/88  Pulse: 77  Resp: 17  Temp: 98.2 F (36.8 C)  TempSrc: Oral  SpO2: 97%  Weight: 150 lb (68 kg)  Height: 5\' 4"  (1.626 m)     General appearance: alert; appears fatigued, but nontoxic; speaking in full sentences and tolerating own secretions HEENT: NCAT; Ears: EACs clear, TMs pearly gray; Eyes: PERRL.  EOM grossly intact. Nose: nares patent without rhinorrhea, Throat: oropharynx erythematous, tonsils erythematous mildly enlarged, uvula erythematous, midline  Neck: supple without LAD Lungs: unlabored respirations, symmetrical air entry; cough: absent; no respiratory distress; CTAB Heart: regular rate and rhythm.   Skin: warm and dry Psychological: alert and cooperative; normal mood and affect  ASSESSMENT & PLAN:  1. COVID-19 virus infection   2. Throat pain   3.  Thrush of mouth and esophagus (HCC)     Meds ordered this encounter  Medications  . nystatin (MYCOSTATIN) 100000 UNIT/ML suspension    Sig: Take 5 mLs (500,000 Units total) by mouth 4 (four) times daily.    Dispense:  473 mL    Refill:  0    Order Specific Question:   Supervising Provider    Answer:   Eustace Moore    COVID test was positive Get plenty of rest and push fluids Continue to alternate ibuprofen and tylenol as needed for body aches and fatigue.   Some covid patients experience chronic fatigue syndrome follow covid infection.  Please follow up with post-covid care center for further evaluation and management of chronic covid symptoms Call or go to the ED if you have any new or worsening symptoms such as fever, worsening cough, shortness of breath, chest tightness, chest pain, turning blue, changes in mental status, etc...   Nystatin prescribed.  Use as directed for between 10-14 days or until symptoms resolve Maintain oral hygiene Follow up with PCP as needed Return or go to the ED if you have any new or worsening symptoms such as fever, chills, nausea, vomiting, difficulty swallowing, fatigue, lymph node swelling, unintentional weight loss, excessive night sweats, etc...   Reviewed expectations re: course of current medical issues. Questions answered. Outlined signs and symptoms indicating need for more acute intervention. Patient verbalized understanding. After Visit Summary given.         [[id number]], PA-C 01/14/20 1401

## 2020-01-14 NOTE — ED Triage Notes (Signed)
Weakness that has not went away since she was dx with covid.  Poss thrush and extremely  foul smelling bowel movements that started yesterday.  Pt was dx with covid on 01/03/2020.

## 2020-01-14 NOTE — Discharge Instructions (Addendum)
COVID test was positive Get plenty of rest and push fluids Continue to alternate ibuprofen and tylenol as needed for body aches and fatigue.   Some covid patients experience chronic fatigue syndrome follow covid infection.  Please follow up with post-covid care center for further evaluation and management of chronic covid symptoms Call or go to the ED if you have any new or worsening symptoms such as fever, worsening cough, shortness of breath, chest tightness, chest pain, turning blue, changes in mental status, etc...   Nystatin prescribed.  Use as directed for between 10-14 days or until symptoms resolve Maintain oral hygiene Follow up with PCP as needed Return or go to the ED if you have any new or worsening symptoms such as fever, chills, nausea, vomiting, difficulty swallowing, fatigue, lymph node swelling, unintentional weight loss, excessive night sweats, etc..Marland Kitchen

## 2020-10-15 ENCOUNTER — Encounter: Payer: Self-pay | Admitting: Emergency Medicine

## 2020-10-15 ENCOUNTER — Other Ambulatory Visit: Payer: Self-pay

## 2020-10-15 ENCOUNTER — Ambulatory Visit
Admission: EM | Admit: 2020-10-15 | Discharge: 2020-10-15 | Disposition: A | Discharge status: Home / Self Care | Payer: Self-pay | Attending: Family Medicine | Admitting: Family Medicine

## 2020-10-15 DIAGNOSIS — R197 Diarrhea, unspecified: Secondary | ICD-10-CM

## 2020-10-15 DIAGNOSIS — L03319 Cellulitis of trunk, unspecified: Secondary | ICD-10-CM

## 2020-10-15 DIAGNOSIS — W57XXXA Bitten or stung by nonvenomous insect and other nonvenomous arthropods, initial encounter: Secondary | ICD-10-CM

## 2020-10-15 DIAGNOSIS — R5383 Other fatigue: Secondary | ICD-10-CM

## 2020-10-15 MED ORDER — DOXYCYCLINE HYCLATE 100 MG PO CAPS: 100.0000 mg | ORAL_CAPSULE | Freq: Two times a day (BID) | ORAL | 0 refills | Status: DC

## 2020-10-15 NOTE — Discharge Instructions (Addendum)
I have sent in doxycycline for you to take one tablet twice a day for 7 days  May take imodium as needed for diarrhea  If not improving after 48 hours on antibiotic, follow up with this office or PCP  Follow up with this office or with primary care if symptoms are persisting.  Follow up in the ER for high fever, trouble swallowing, trouble breathing, other concerning symptoms.

## 2020-10-15 NOTE — ED Triage Notes (Signed)
Poss insect bite under LT breast. Area is red and itching. Pt reports she thought she had a skin tag there which is gone now so it could have been a tick.  Also reports headache, fatigue and diarrhea that started this morning.

## 2020-10-15 NOTE — ED Provider Notes (Signed)
[facility]   [id number] 10/15/20 Arrival Time: 0802  CC: RASH  SUBJECTIVE:  Crystal Villa is a 60 y.o. female who presents with a skin complaint that began yesterday. Also reports diarrhea, headache, fatigue as well. Reports that she has a bulls eye rash to the left trunk  under the left breast. Reports that the area is erythematous, swollen and itchy. Has used cortisone cream to the area with mild relief. Denies precipitating event or trauma.  Denies changes in soaps, detergents, close contacts with similar rash, known trigger or environmental trigger, allergy. Denies medications change or starting a new medication recently. There are no aggravating or alleviating factors. Denies similar symptoms in the past.  Denies fever, chills, nausea, vomiting, discharge, oral lesions, SOB, chest pain, abdominal pain, changes in bowel or bladder function.    ROS: As per HPI.  All other pertinent ROS negative.     Past Medical History:  Diagnosis Date   COPD (chronic obstructive pulmonary disease) (HCC)    Past Surgical History:  Procedure Laterality Date   ABDOMINAL HYSTERECTOMY     BACK SURGERY     No Known Allergies No current facility-administered medications on file prior to encounter.   Current Outpatient Medications on File Prior to Encounter  Medication Sig Dispense Refill   nystatin (MYCOSTATIN) 100000 UNIT/ML suspension Take 5 mLs (500,000 Units total) by mouth 4 (four) times daily. 473 mL 0   [DISCONTINUED] cetirizine (ZYRTEC ALLERGY) 10 MG tablet Take 1 tablet (10 mg total) by mouth daily. 30 tablet 0   [DISCONTINUED] fluticasone (FLONASE) 50 MCG/ACT nasal spray Place 1 spray into both nostrils daily for 14 days. 16 g 0   Social History   Socioeconomic History   Marital status: Single    Spouse name: Not on file   Number of children: Not on file   Years of education: Not on file   Highest education level: Not on file  Occupational History   Not on file   Tobacco Use   Smoking status: Former    Pack years: 0.00   Smokeless tobacco: Never  Substance and Sexual Activity   Alcohol use: Never   Drug use: Never   Sexual activity: Not on file  Other Topics Concern   Not on file  Social History Narrative   Not on file   Social Determinants of Health   Financial Resource Strain: Not on file  Food Insecurity: Not on file  Transportation Needs: Not on file  Physical Activity: Not on file  Stress: Not on file  Social Connections: Not on file  Intimate Partner Violence: Not on file   Family History  Problem Relation Age of Onset   Healthy Mother    Hyperlipidemia Father     OBJECTIVE: Vitals:   10/15/20 0821  BP: 126/78  Pulse: 77  Resp: 18  Temp: 98.1 F (36.7 C)  TempSrc: Oral  SpO2: 97%    General appearance: alert; no distress Head: NCAT Lungs: clear to auscultation bilaterally Heart: regular rate and rhythm.  Radial pulse 2+ bilaterally Extremities: no edema Skin: warm and dry; bulls eye shaped rash to left trunk with central lesion consistent with bug bite, lesion is also erythematous, warm, tender to touch, no bleeding, no drainage noted Psychological: alert and cooperative; normal mood and affect  ASSESSMENT & PLAN:  1. Cellulitis of trunk, unspecified site of trunk   2. Bug bite, initial encounter   3. Other fatigue   4. Diarrhea, unspecified type  Meds ordered this encounter  Medications   doxycycline (VIBRAMYCIN) 100 MG capsule    Sig: Take 1 capsule (100 mg total) by mouth 2 (two) times daily.    Dispense:  14 capsule    Refill:  0    Order Specific Question:   Supervising Provider    Answer:   Merrilee Jansky [id number]    Prescribed doxycycline 100mg  BID x 7 days Take as prescribed and to completion If not improving about 48 hours after starting antibiotic, follow up with this office/PCP Avoid hot showers/ baths Moisturize skin daily  Follow up with PCP if symptoms persists Return or go  to the ER if you have any new or worsening symptoms such as fever, chills, nausea, vomiting, redness, swelling, discharge, if symptoms do not improve with medications  Reviewed expectations re: course of current medical issues. Questions answered. Outlined signs and symptoms indicating need for more acute intervention. Patient verbalized understanding. After Visit Summary given.    , NP 10/15/20 [phone]

## 2021-02-26 ENCOUNTER — Other Ambulatory Visit: Payer: Self-pay

## 2021-02-26 ENCOUNTER — Encounter: Payer: Self-pay | Admitting: Emergency Medicine

## 2021-02-26 ENCOUNTER — Ambulatory Visit
Admission: EM | Admit: 2021-02-26 | Discharge: 2021-02-26 | Disposition: A | Discharge status: Home / Self Care | Payer: 59 | Attending: Family Medicine | Admitting: Family Medicine

## 2021-02-26 DIAGNOSIS — J069 Acute upper respiratory infection, unspecified: Secondary | ICD-10-CM | POA: Diagnosis not present

## 2021-02-26 MED ORDER — BENZONATATE 100 MG PO CAPS: ORAL_CAPSULE | ORAL | 0 refills | Status: DC

## 2021-02-26 NOTE — ED Provider Notes (Signed)
  [facility]   [id number] 02/26/21 Arrival Time: 4132  ASSESSMENT & PLAN:  1. Viral URI with cough    Discussed typical duration of viral illnesses. OTC symptom care as needed.  Meds ordered this encounter  Medications   benzonatate (TESSALON) 100 MG capsule    Sig: Take 1 capsule by mouth every 8 (eight) hours for cough.    Dispense:  21 capsule    Refill:  0     Follow-up Information     [facility].   Specialty: Urgent Care Why: As needed. Contact information: [address] [place] [address] [phone]                Reviewed expectations re: course of current medical issues. Questions answered. Outlined signs and symptoms indicating need for more acute intervention. Understanding verbalized. After Visit Summary given.   SUBJECTIVE: History from: patient. Caela Huot Kohls is a 60 y.o. female who reports: nasal cong/sinus pressure and cough; abrupt onset; x 3 days. Denies: fever and difficulty breathing. Normal PO intake without n/v/d. NyQuil without much relief.  OBJECTIVE:  Vitals:   02/26/21 0943  BP: 109/76  Pulse: 78  Resp: 18  Temp: 98.3 F (36.8 C)  TempSrc: Oral  SpO2: 96%    General appearance: alert; no distress Eyes: PERRLA; EOMI; conjunctiva normal HENT: [place]; AT; with nasal congestion Neck: supple  Lungs: speaks full sentences without difficulty; unlabored; clear Extremities: no edema Skin: warm and dry Neurologic: normal gait Psychological: alert and cooperative; normal mood and affect   No Known Allergies  Past Medical History:  Diagnosis Date   COPD (chronic obstructive pulmonary disease) (HCC)    Social History   Socioeconomic History   Marital status: Single    Spouse name: Not on file   Number of children: Not on file   Years of education: Not on file   Highest education level: Not on file  Occupational History   Not on file   Tobacco Use   Smoking status: Former   Smokeless tobacco: Never  Substance and Sexual Activity   Alcohol use: Never   Drug use: Never   Sexual activity: Not on file  Other Topics Concern   Not on file  Social History Narrative   Not on file   Social Determinants of Health   Financial Resource Strain: Not on file  Food Insecurity: Not on file  Transportation Needs: Not on file  Physical Activity: Not on file  Stress: Not on file  Social Connections: Not on file  Intimate Partner Violence: Not on file   Family History  Problem Relation Age of Onset   Healthy Mother    Hyperlipidemia Father    Past Surgical History:  Procedure Laterality Date   ABDOMINAL HYSTERECTOMY     BACK SURGERY       Mardella Layman, MD 02/26/21 1001

## 2021-02-26 NOTE — ED Triage Notes (Addendum)
Patient c/o nasal congestion x 3 weeks.   Patient c/o sinus pressure x 3 days.   Patient denies fever at home.   Patient endorses worsening sinus pressure. Patient endorses LFT sided ear pressure.   Patient endorses diarrhea, fatigue, and productive cough.   Patient has taken Nyquil with no relief of symptoms.

## 2021-06-08 ENCOUNTER — Other Ambulatory Visit: Payer: Self-pay

## 2021-06-08 ENCOUNTER — Ambulatory Visit
Admission: EM | Admit: 2021-06-08 | Discharge: 2021-06-08 | Disposition: A | Discharge status: Home / Self Care | Payer: 59

## 2021-06-08 DIAGNOSIS — J449 Chronic obstructive pulmonary disease, unspecified: Secondary | ICD-10-CM | POA: Diagnosis not present

## 2021-06-08 DIAGNOSIS — S8012XA Contusion of left lower leg, initial encounter: Secondary | ICD-10-CM | POA: Diagnosis not present

## 2021-06-08 DIAGNOSIS — K219 Gastro-esophageal reflux disease without esophagitis: Secondary | ICD-10-CM | POA: Diagnosis not present

## 2021-06-08 MED ORDER — PANTOPRAZOLE SODIUM 40 MG PO TBEC: 40.0000 mg | DELAYED_RELEASE_TABLET | Freq: Every day | ORAL | 2 refills | Status: DC

## 2021-06-08 MED ORDER — SUCRALFATE 1 G PO TABS: 1.0000 g | ORAL_TABLET | Freq: Three times a day (TID) | ORAL | 1 refills | Status: DC | PRN

## 2021-06-08 MED ORDER — ANORO ELLIPTA 62.5-25 MCG/ACT IN AEPB: INHALATION_SPRAY | RESPIRATORY_TRACT | 2 refills | Status: DC

## 2021-06-08 MED ORDER — ALBUTEROL SULFATE HFA 108 (90 BASE) MCG/ACT IN AERS: 1.0000 | INHALATION_SPRAY | Freq: Four times a day (QID) | RESPIRATORY_TRACT | 0 refills | Status: DC | PRN

## 2021-06-08 NOTE — ED Triage Notes (Signed)
Pt presents with c/o epigastric pain that began last night , has h/o gerd but pain has not been relieved with over the counter medication , also has fall last week and has left lower leg pain

## 2021-06-08 NOTE — ED Provider Notes (Signed)
[facility]    CSN: [id number] Arrival date & time: 06/08/21  0913      History   Chief Complaint Chief Complaint  Patient presents with   Fall   Leg Pain    HPI Crystal Villa is a 61 y.o. female.   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  Did not hit her head during the fall and did not land fully on the ground, states she was able to catch herself on a dog food bag that was sitting nearby.  She thinks she may have bumped the lower leg into something on the way down.  She does have some mild bruising and swelling to the area, has tried lidocaine patches, warm compresses with mild temporary relief.  Denies loss of range of motion, numbness, tingling, diffuse swelling or other injury from the fall.  Also complaining of worsening acid reflux symptoms the past day or so.  She states this happens to her off-and-on, takes over-the-counter Prilosec daily but sometimes has to take it twice a day.  She did take it twice yesterday which helped some and took some Pepto-Bismol which also helped take the edge off.  Has been belching, burning from epigastrium to her esophagus.  No new foods or medications that she can think of that would have triggered a flareup.  Denies chest pain, shortness of breath, dizziness, syncope.   Past Medical History:  Diagnosis Date   COPD (chronic obstructive pulmonary disease) (HCC)     There are no problems to display for this patient.   Past Surgical History:  Procedure Laterality Date   ABDOMINAL HYSTERECTOMY     BACK SURGERY      OB History   No obstetric history on file.      Home Medications    Prior to Admission medications   Medication Sig Start Date End Date Taking? Authorizing Provider  albuterol (VENTOLIN HFA) 108 (90 Base) MCG/ACT inhaler Inhale into the lungs. 03/21/19  Yes [provider]  albuterol (VENTOLIN HFA) 108 (90 Base) MCG/ACT  inhaler Inhale 1-2 puffs into the lungs every 6 (six) hours as needed for wheezing or shortness of breath. 06/08/21  Yes Particia Nearing, PA-C  pantoprazole (PROTONIX) 40 MG tablet Take 1 tablet (40 mg total) by mouth daily. 06/08/21  Yes Particia Nearing, PA-C  sucralfate (CARAFATE) 1 g tablet Take 1 tablet (1 g total) by mouth 3 (three) times daily as needed. 06/08/21  Yes Particia Nearing, PA-C  benzonatate (TESSALON) 100 MG capsule Take 1 capsule by mouth every 8 (eight) hours for cough. 02/26/21   Mardella Layman, MD  umeclidinium-vilanterol [facility] ELLIPTA) 62.5-25 MCG/ACT AEPB INHALE 1 PUFF INTO THE LUNGS ONCE DAILY 06/08/21   Particia Nearing, PA-C  cetirizine (ZYRTEC ALLERGY) 10 MG tablet Take 1 tablet (10 mg total) by mouth daily. 10/02/19 01/14/20  Avegno, Zachery Dakins, FNP  fluticasone (FLONASE) 50 MCG/ACT nasal spray Place 1 spray into both nostrils daily for 14 days. 01/03/20 01/14/20  AvegnoZachery Dakins, FNP    Family History Family History  Problem Relation Age of Onset   Healthy Mother    Hyperlipidemia Father     Social History Social History   Tobacco Use   Smoking status: Former   Smokeless tobacco: Never  Substance Use Topics   Alcohol use: Never   Drug use: Never     Allergies   Patient has  no known allergies.   Review of Systems Review of Systems Per HPI  Physical Exam Triage Vital Signs ED Triage Vitals [06/08/21 1211]  Enc Vitals Group     BP (!) 135/92     Pulse Rate 81     Resp 20     Temp 97.8 F (36.6 C)     Temp src      SpO2 98 %     Weight      Height      Head Circumference      Peak Flow      Pain Score 6     Pain Loc      Pain Edu?      Excl. in GC?    No data found.  Updated Vital Signs BP (!) 135/92    Pulse 81    Temp 97.8 F (36.6 C)    Resp 20    SpO2 98%   Visual Acuity Right Eye Distance:   Left Eye Distance:   Bilateral Distance:    Right Eye Near:   Left Eye Near:    Bilateral Near:      Physical Exam Vitals and nursing note reviewed.  Constitutional:      Appearance: Normal appearance. She is not ill-appearing.  HENT:     Head: Atraumatic.     Mouth/Throat:     Mouth: Mucous membranes are moist.  Eyes:     Extraocular Movements: Extraocular movements intact.     Conjunctiva/sclera: Conjunctivae normal.  Cardiovascular:     Rate and Rhythm: Normal rate and regular rhythm.     Heart sounds: Normal heart sounds.  Pulmonary:     Effort: Pulmonary effort is normal. No respiratory distress.     Breath sounds: Normal breath sounds. No wheezing or rales.  Chest:     Chest wall: No tenderness.  Abdominal:     General: Bowel sounds are normal. There is no distension.     Palpations: Abdomen is soft.     Tenderness: There is no abdominal tenderness. There is no guarding.  Musculoskeletal:        General: Swelling, tenderness and signs of injury present. Normal range of motion.     Cervical back: Normal range of motion and neck supple.     Comments: Range of motion full and intact  Skin:    General: Skin is warm and dry.     Findings: Bruising present. No erythema.     Comments: Small contusion, tender to palpation left lateral lower leg.  Negative Homans' sign, negative squeeze test.    Neurological:     Mental Status: She is alert and oriented to person, place, and time.     Motor: No weakness.     Gait: Gait normal.  Psychiatric:        Mood and Affect: Mood normal.        Thought Content: Thought content normal.        Judgment: Judgment normal.     UC Treatments / Results  Labs (all labs ordered are listed, but only abnormal results are displayed) Labs Reviewed - No data to display  EKG   Radiology No results found.  Procedures Procedures (including critical care time)  Medications Ordered in UC Medications - No data to display  Initial Impression / Assessment and Plan / UC Course  I have reviewed the triage vital signs and the nursing  notes.  Pertinent labs & imaging results that were available during my  care of the patient were reviewed by me and considered in my medical decision making (see chart for details).   Regarding her leg pain, suspect contusion from the fall.  Very low suspicion for DVT, bony injury so will defer further work-up at this time with shared decision making.  We will treat with over-the-counter pain relievers, warm compresses, continue monitoring and follow-up if not improving or worsening.  GI cocktail was given in triage for her reflux symptoms with moderate improvement.  She states she is belching since taking the cocktail and feels like the pressure is releasing.  Her EKG today shows normal sinus rhythm at 68 bpm without acute ST or T wave changes, very low suspicion for cardiopulmonary cause of her symptoms.  Return precautions also given for this.  She also notes toward the end of the visit that she is awaiting establish care visit with a new primary care provider and has been out of her COPD inhaler for quite some time.  We will refill Anoro and albuterol to bridge her until this visit.  Final Clinical Impressions(s) / UC Diagnoses   Final diagnoses:  Contusion of left lower extremity, initial encounter  Gastroesophageal reflux disease, unspecified whether esophagitis present  Chronic obstructive pulmonary disease, unspecified COPD type [facility])   Discharge Instructions   None    ED Prescriptions     Medication Sig Dispense Auth. Provider   pantoprazole (PROTONIX) 40 MG tablet Take 1 tablet (40 mg total) by mouth daily. 30 tablet Particia Nearing, [place]   sucralfate (CARAFATE) 1 g tablet Take 1 tablet (1 g total) by mouth 3 (three) times daily as needed. 90 tablet Roosvelt Maser [place], [place]   umeclidinium-vilanterol [facility] ELLIPTA) 62.5-25 MCG/ACT AEPB INHALE 1 PUFF INTO THE LUNGS ONCE DAILY 60 each Particia Nearing, PA-C   albuterol (VENTOLIN HFA) 108 (90 Base) MCG/ACT inhaler Inhale  1-2 puffs into the lungs every 6 (six) hours as needed for wheezing or shortness of breath. 18 g Particia Nearing, [place]      PDMP not reviewed this encounter.   Cass, [place], [place] 06/08/21 1346

## 2021-07-15 ENCOUNTER — Other Ambulatory Visit: Payer: Self-pay | Admitting: Registered Nurse

## 2021-07-15 ENCOUNTER — Encounter: Payer: Self-pay | Admitting: Registered Nurse

## 2021-07-15 ENCOUNTER — Other Ambulatory Visit: Payer: Self-pay

## 2021-07-15 ENCOUNTER — Ambulatory Visit: Payer: 59 | Admitting: Registered Nurse

## 2021-07-15 VITALS — BP 113/83 | HR 81 | Temp 98.3°F | Resp 18 | Ht 64.0 in | Wt 169.6 lb

## 2021-07-15 DIAGNOSIS — Z0000 Encounter for general adult medical examination without abnormal findings: Secondary | ICD-10-CM | POA: Diagnosis not present

## 2021-07-15 DIAGNOSIS — J431 Panlobular emphysema: Secondary | ICD-10-CM

## 2021-07-15 DIAGNOSIS — Z1159 Encounter for screening for other viral diseases: Secondary | ICD-10-CM | POA: Diagnosis not present

## 2021-07-15 DIAGNOSIS — Z13220 Encounter for screening for lipoid disorders: Secondary | ICD-10-CM | POA: Diagnosis not present

## 2021-07-15 DIAGNOSIS — K219 Gastro-esophageal reflux disease without esophagitis: Secondary | ICD-10-CM

## 2021-07-15 DIAGNOSIS — Z130 Encounter for screening for diseases of the blood and blood-forming organs and certain disorders involving the immune mechanism: Secondary | ICD-10-CM

## 2021-07-15 DIAGNOSIS — Z86010 Personal history of colonic polyps: Secondary | ICD-10-CM

## 2021-07-15 DIAGNOSIS — Z1329 Encounter for screening for other suspected endocrine disorder: Secondary | ICD-10-CM | POA: Diagnosis not present

## 2021-07-15 DIAGNOSIS — Z1231 Encounter for screening mammogram for malignant neoplasm of breast: Secondary | ICD-10-CM

## 2021-07-15 DIAGNOSIS — Z13228 Encounter for screening for other metabolic disorders: Secondary | ICD-10-CM

## 2021-07-15 MED ORDER — ALBUTEROL SULFATE HFA 108 (90 BASE) MCG/ACT IN AERS: 1.0000 | INHALATION_SPRAY | Freq: Four times a day (QID) | RESPIRATORY_TRACT | 0 refills | Status: DC | PRN

## 2021-07-15 MED ORDER — ANORO ELLIPTA 62.5-25 MCG/ACT IN AEPB: INHALATION_SPRAY | RESPIRATORY_TRACT | 2 refills | Status: DC

## 2021-07-15 MED ORDER — PANTOPRAZOLE SODIUM 40 MG PO TBEC: 40.0000 mg | DELAYED_RELEASE_TABLET | Freq: Every day | ORAL | 2 refills | Status: DC

## 2021-07-15 NOTE — Patient Instructions (Addendum)
Crystal Villa -  ? ?Great to meet you! ? ?No concerns on exam ? ?Let's get an appt for you booked on 08/14/21 for pap and labs. ? ?I will call with results and follow up plan ? ?Mammogram and colonoscopy orders sent - these offices will call you to set up appts! ? ?Let me know if you need anything ? ?Thanks, ? ?Rich  ? ? ? ?If you have lab work done today you will be contacted with your lab results within the next 2 weeks.  If you have not heard from [place] then please contact us. The fastest way to get your results is to register for My Chart. ? ? ?IF you received an x-ray today, you will receive an invoice from [facility]. Please contact [facility] at [phone] with questions or concerns regarding your invoice.  ? ?IF you received labwork today, you will receive an invoice from [facility]. Please contact [facility] at [phone] with questions or concerns regarding your invoice.  ? ?Our billing staff will not be able to assist you with questions regarding bills from these companies. ? ?You will be contacted with the lab results as soon as they are available. The fastest way to get your results is to activate your My Chart account. Instructions are located on the last page of this paperwork. If you have not heard from [place] regarding the results in 2 weeks, please contact this office. ?  ? ? ?

## 2021-07-15 NOTE — Progress Notes (Signed)
? ?Complete physical exam ? ?Patient: Crystal Villa   DOB: Jan 26, 1961   61 y.o. Female  MRN: [id number] ?Visit Date: 07/15/2021 ? ?Subjective:  ?  ?Chief Complaint  ?Patient presents with  ? New Patient (Initial Visit)  ?  Patient states she is here to establish care.  ? ? ?Crystal Villa is a 61 y.o. female who presents today for a complete physical exam. She reports consuming a general diet. Exercise is limited by respiratory condition(s): COPD. She generally feels well. She reports sleeping well. She does not have additional problems to discuss today.  ? ?Vision:Within the last year ?Dental:Within Last 6 months ?STD Screen:No ? ?HM: ?Due for mammogram and colon ca screen ?Referrals placed. ?Due for pap - will return to have this with female provider.  ? ?Most recent fall risk assessment: ? ?  07/15/2021  ?  3:59 PM  ?Fall Risk   ?Falls in the past year? 1  ?Number falls in past yr: 1  ?Injury with Fall? 1  ?Risk for fall due to : History of fall(s)  ?Follow up Falls evaluation completed  ? ?  ?Most recent depression screenings: ? ?  07/15/2021  ?  4:00 PM  ?PHQ 2/9 Scores  ?PHQ - 2 Score 0  ?PHQ- 9 Score 0  ? ? ? ? ?Patient Care Team: ?Janeece Agee, NP as PCP - General (Adult Health Nurse Practitioner)  ? ?Medications: ?Outpatient Medications Prior to Visit  ?Medication Sig  ? albuterol (VENTOLIN HFA) 108 (90 Base) MCG/ACT inhaler Inhale into the lungs.  ? benzonatate (TESSALON) 100 MG capsule Take 1 capsule by mouth every 8 (eight) hours for cough.  ? sucralfate (CARAFATE) 1 g tablet Take 1 tablet (1 g total) by mouth 3 (three) times daily as needed.  ? [DISCONTINUED] albuterol (VENTOLIN HFA) 108 (90 Base) MCG/ACT inhaler Inhale 1-2 puffs into the lungs every 6 (six) hours as needed for wheezing or shortness of breath.  ? [DISCONTINUED] pantoprazole (PROTONIX) 40 MG tablet Take 1 tablet (40 mg total) by mouth daily.  ? [DISCONTINUED] umeclidinium-vilanterol (ANORO ELLIPTA) 62.5-25 MCG/ACT AEPB  INHALE 1 PUFF INTO THE LUNGS ONCE DAILY  ? ?No facility-administered medications prior to visit.  ? ? ?Review of Systems  ?Constitutional: Negative.   ?HENT: Negative.    ?Eyes: Negative.   ?Respiratory: Negative.    ?Cardiovascular: Negative.   ?Gastrointestinal: Negative.   ?Genitourinary: Negative.   ?Musculoskeletal: Negative.   ?Skin: Negative.   ?Neurological: Negative.   ?Psychiatric/Behavioral: Negative.    ?All other systems reviewed and are negative. ? ? ?   ?Objective:  ? ?  ?BP 113/83   Pulse 81   Temp 98.3 ?F (36.8 ?C) (Temporal)   Resp 18   Ht 5\' 4"  (1.626 m)   Wt 169 lb 9.6 oz (76.9 kg)   SpO2 96%   BMI 29.11 kg/m?  ? ? ? ?Physical Exam ?Vitals and nursing note reviewed.  ?Constitutional:   ?   General: She is not in acute distress. ?   Appearance: Normal appearance. She is normal weight. She is not ill-appearing, toxic-appearing or diaphoretic.  ?HENT:  ?   Head: Normocephalic and atraumatic.  ?   Right Ear: Tympanic membrane, ear canal and external ear normal. There is no impacted cerumen.  ?   Left Ear: Tympanic membrane, ear canal and external ear normal. There is no impacted cerumen.  ?   Nose: Nose normal. No congestion or rhinorrhea.  ?   Mouth/Throat:  ?  Mouth: Mucous membranes are moist.  ?   Pharynx: Oropharynx is clear. No oropharyngeal exudate or posterior oropharyngeal erythema.  ?Eyes:  ?   General: No scleral icterus.    ?   Right eye: No discharge.     ?   Left eye: No discharge.  ?   Extraocular Movements: Extraocular movements intact.  ?   Conjunctiva/sclera: Conjunctivae normal.  ?   Pupils: Pupils are equal, round, and reactive to light.  ?Neck:  ?   Vascular: No carotid bruit.  ?Cardiovascular:  ?   Rate and Rhythm: Normal rate and regular rhythm.  ?   Pulses: Normal pulses.  ?   Heart sounds: Normal heart sounds. No murmur heard. ?  No friction rub. No gallop.  ?Pulmonary:  ?   Effort: Pulmonary effort is normal. No respiratory distress.  ?   Breath sounds: No stridor.  Wheezing (pablobular. consistent with emphysema) present. No rhonchi or rales.  ?Chest:  ?   Chest wall: No tenderness.  ?Abdominal:  ?   General: Abdomen is flat. Bowel sounds are normal. There is no distension.  ?   Palpations: There is no mass.  ?   Tenderness: There is no abdominal tenderness. There is no right CVA tenderness, left CVA tenderness, guarding or rebound.  ?   Hernia: No hernia is present.  ?Musculoskeletal:     ?   General: No swelling, tenderness, deformity or signs of injury. Normal range of motion.  ?   Cervical back: Normal range of motion and neck supple. No rigidity or tenderness.  ?   Right lower leg: No edema.  ?   Left lower leg: No edema.  ?Lymphadenopathy:  ?   Cervical: No cervical adenopathy.  ?Skin: ?   General: Skin is warm and dry.  ?   Capillary Refill: Capillary refill takes less than 2 seconds.  ?   Coloration: Skin is not jaundiced or pale.  ?   Findings: No bruising, erythema, lesion or rash.  ?Neurological:  ?   General: No focal deficit present.  ?   Mental Status: She is alert and oriented to person, place, and time. Mental status is at baseline.  ?   Cranial Nerves: No cranial nerve deficit.  ?   Sensory: No sensory deficit.  ?   Motor: No weakness.  ?   Coordination: Coordination normal.  ?   Gait: Gait normal.  ?   Deep Tendon Reflexes: Reflexes normal.  ?Psychiatric:     ?   Mood and Affect: Mood normal.     ?   Behavior: Behavior normal.     ?   Thought Content: Thought content normal.     ?   Judgment: Judgment normal.  ?  ? ?No results found for any visits on 07/15/21. ?   ?Assessment & Plan:  ?  ?Routine Health Maintenance and Physical Exam ? ? ?There is no immunization history on file for this patient. ? ?Health Maintenance  ?Topic Date Due  ? COVID-19 Vaccine (1) Never done  ? HIV Screening  Never done  ? Hepatitis C Screening  Never done  ? PAP SMEAR-Modifier  Never done  ? COLONOSCOPY (Pts 45-181yrs Insurance coverage will need to be confirmed)  Never done  ?  MAMMOGRAM  Never done  ? INFLUENZA VACCINE  07/17/2021 (Originally 11/17/2020)  ? Zoster Vaccines- Shingrix (1 of 2) 10/15/2021 (Originally 09/22/2012)  ? TETANUS/TDAP  07/16/2022 (Originally 09/22/1981)  ? HPV VACCINES  Aged Out  ? ? ?  Discussed health benefits of physical activity, and encouraged her to engage in regular exercise appropriate for her age and condition. ? ?Problem List Items Addressed This Visit   ?None ?Visit Diagnoses   ? ? Annual physical exam    -  Primary  ? Screening for endocrine, metabolic and immunity disorder      ? Relevant Orders  ? CBC with Differential/Platelet  ? Comprehensive metabolic panel  ? Hemoglobin A1c  ? TSH  ? Lipid screening      ? Relevant Orders  ? Lipid panel  ? Encounter for screening for other viral diseases      ? Relevant Orders  ? Hepatitis C Antibody  ? HIV antibody (with reflex)  ? Screening mammogram for breast cancer      ? Relevant Orders  ? MM Digital Screening  ? History of colon polyps      ? Relevant Orders  ? Ambulatory referral to Gastroenterology  ? Panlobular emphysema (HCC)      ? Relevant Medications  ? albuterol (VENTOLIN HFA) 108 (90 Base) MCG/ACT inhaler  ? umeclidinium-vilanterol (ANORO ELLIPTA) 62.5-25 MCG/ACT AEPB  ? Gastroesophageal reflux disease without esophagitis      ? Relevant Medications  ? pantoprazole (PROTONIX) 40 MG tablet  ? ?  ? ?Return in 30 days (on 08/14/2021) for Pap only w/ tabori, labs. ? ?  ?PLAN ?Wheezing on exam consistent with emphysema ?Otherwise exam unremarkable ?Future labs placed ?She will return on 08/14/21 to have pap and labs. ?Mammo order placed ?Colonoscopy referral sent ?Patient encouraged to call clinic with any questions, comments, or concerns. ? ? ?Janeece Agee, NP ?

## 2021-07-17 ENCOUNTER — Ambulatory Visit: Payer: 59 | Admitting: Registered Nurse

## 2021-07-21 ENCOUNTER — Ambulatory Visit: Payer: 59 | Admitting: Registered Nurse

## 2021-08-14 ENCOUNTER — Ambulatory Visit
Admission: RE | Admit: 2021-08-14 | Discharge: 2021-08-14 | Disposition: A | Discharge status: Home / Self Care | Payer: 59 | Source: Ambulatory Visit | Attending: Registered Nurse | Admitting: Registered Nurse

## 2021-08-14 ENCOUNTER — Ambulatory Visit: Payer: 59 | Admitting: Family Medicine

## 2021-08-14 DIAGNOSIS — Z1231 Encounter for screening mammogram for malignant neoplasm of breast: Secondary | ICD-10-CM

## 2021-08-19 ENCOUNTER — Ambulatory Visit: Payer: 59 | Admitting: Registered Nurse

## 2021-08-19 ENCOUNTER — Other Ambulatory Visit (HOSPITAL_COMMUNITY)
Admission: RE | Admit: 2021-08-19 | Discharge: 2021-08-19 | Disposition: A | Discharge status: Home / Self Care | Payer: 59 | Source: Ambulatory Visit | Attending: Registered Nurse | Admitting: Registered Nurse

## 2021-08-19 ENCOUNTER — Encounter: Payer: Self-pay | Admitting: Registered Nurse

## 2021-08-19 VITALS — BP 139/72 | HR 76 | Temp 98.3°F | Resp 18 | Ht 64.0 in | Wt 174.2 lb

## 2021-08-19 DIAGNOSIS — Z124 Encounter for screening for malignant neoplasm of cervix: Secondary | ICD-10-CM | POA: Insufficient documentation

## 2021-08-19 DIAGNOSIS — Z130 Encounter for screening for diseases of the blood and blood-forming organs and certain disorders involving the immune mechanism: Secondary | ICD-10-CM | POA: Diagnosis not present

## 2021-08-19 DIAGNOSIS — Z1329 Encounter for screening for other suspected endocrine disorder: Secondary | ICD-10-CM | POA: Diagnosis not present

## 2021-08-19 DIAGNOSIS — Z1159 Encounter for screening for other viral diseases: Secondary | ICD-10-CM | POA: Diagnosis not present

## 2021-08-19 DIAGNOSIS — Z13220 Encounter for screening for lipoid disorders: Secondary | ICD-10-CM | POA: Diagnosis not present

## 2021-08-19 DIAGNOSIS — Z13228 Encounter for screening for other metabolic disorders: Secondary | ICD-10-CM | POA: Diagnosis not present

## 2021-08-19 LAB — CBC WITH DIFFERENTIAL/PLATELET
Basophils Absolute: 0.1 10*3/uL (ref 0.0–0.1)
Basophils Relative: 0.7 % (ref 0.0–3.0)
Eosinophils Absolute: 0.1 10*3/uL (ref 0.0–0.7)
Eosinophils Relative: 1.8 % (ref 0.0–5.0)
HCT: 38.4 % (ref 36.0–46.0)
Hemoglobin: 13 g/dL (ref 12.0–15.0)
Lymphocytes Relative: 29.8 % (ref 12.0–46.0)
Lymphs Abs: 2.2 10*3/uL (ref 0.7–4.0)
MCHC: 34 g/dL (ref 30.0–36.0)
MCV: 90.3 fl (ref 78.0–100.0)
Monocytes Absolute: 0.5 10*3/uL (ref 0.1–1.0)
Monocytes Relative: 7.5 % (ref 3.0–12.0)
Neutro Abs: 4.4 10*3/uL (ref 1.4–7.7)
Neutrophils Relative %: 60.2 % (ref 43.0–77.0)
Platelets: 207 10*3/uL (ref 150.0–400.0)
RBC: 4.25 Mil/uL (ref 3.87–5.11)
RDW: 13.6 % (ref 11.5–15.5)
WBC: 7.2 10*3/uL (ref 4.0–10.5)

## 2021-08-19 LAB — COMPREHENSIVE METABOLIC PANEL
ALT: 22 U/L (ref 0–35)
AST: 26 U/L (ref 0–37)
Albumin: 4.2 g/dL (ref 3.5–5.2)
Alkaline Phosphatase: 91 U/L (ref 39–117)
BUN: 14 mg/dL (ref 6–23)
CO2: 28 mEq/L (ref 19–32)
Calcium: 9 mg/dL (ref 8.4–10.5)
Chloride: 106 mEq/L (ref 96–112)
Creatinine, Ser: 0.49 mg/dL (ref 0.40–1.20)
GFR: 103.54 mL/min (ref >60.00)
Glucose, Bld: 102 mg/dL — ABNORMAL HIGH (ref 70–99)
Potassium: 3.2 mEq/L — ABNORMAL LOW (ref 3.5–5.1)
Sodium: 140 mEq/L (ref 135–145)
Total Bilirubin: 0.6 mg/dL (ref 0.2–1.2)
Total Protein: 6.4 g/dL (ref 6.0–8.3)

## 2021-08-19 LAB — LIPID PANEL
Cholesterol: 166 mg/dL (ref 0–200)
HDL: 54.3 mg/dL (ref >39.00)
LDL Cholesterol: 95 mg/dL (ref 0–99)
NonHDL: 111.99
Total CHOL/HDL Ratio: 3
Triglycerides: 85 mg/dL (ref 0.0–149.0)
VLDL: 17 mg/dL (ref 0.0–40.0)

## 2021-08-19 LAB — HEMOGLOBIN A1C: Hgb A1c MFr Bld: 6 % (ref 4.6–6.5)

## 2021-08-19 LAB — TSH: TSH: 2.47 u[IU]/mL (ref 0.35–5.50)

## 2021-08-19 NOTE — Progress Notes (Signed)
? ?Established Patient Office Visit ? ?Subjective:  ?Patient ID: Crystal Villa, female    DOB: 10-Mar-1961  Age: 61 y.o. MRN: [id number] ? ?CC:  ?Chief Complaint  ?Patient presents with  ? Gynecologic Exam  ?  Patient states she is here to get a pap to check for cervical cancer only.and also Blood work  ? ? ?HPI ?Crystal Villa presents for pap, labs ? ?Routine, no urgent concerns.  ?No pap on file, but is sure it has been greater than 5 years. ? ?Outpatient Medications Prior to Visit  ?Medication Sig Dispense Refill  ? albuterol (VENTOLIN HFA) 108 (90 Base) MCG/ACT inhaler Inhale into the lungs.    ? benzonatate (TESSALON) 100 MG capsule Take 1 capsule by mouth every 8 (eight) hours for cough. 21 capsule 0  ? levalbuterol (XOPENEX HFA) 45 MCG/ACT inhaler Inhale 1-2 puffs into the lungs every 4 (four) hours as needed for wheezing. 15 g 0  ? pantoprazole (PROTONIX) 40 MG tablet Take 1 tablet (40 mg total) by mouth daily. 30 tablet 2  ? sucralfate (CARAFATE) 1 g tablet Take 1 tablet (1 g total) by mouth 3 (three) times daily as needed. 90 tablet 1  ? umeclidinium-vilanterol (ANORO ELLIPTA) 62.5-25 MCG/ACT AEPB INHALE 1 PUFF INTO THE LUNGS ONCE DAILY 60 each 2  ? ?No facility-administered medications prior to visit.  ? ? ?Review of Systems  ?Constitutional: Negative.   ?HENT: Negative.    ?Eyes: Negative.   ?Respiratory: Negative.    ?Cardiovascular: Negative.   ?Gastrointestinal: Negative.   ?Genitourinary: Negative.   ?Musculoskeletal: Negative.   ?Skin: Negative.   ?Neurological: Negative.   ?Psychiatric/Behavioral: Negative.    ?All other systems reviewed and are negative. ? ?  ?Objective:  ?  ? ?BP 139/72   Pulse 76   Temp 98.3 ?F (36.8 ?C) (Temporal)   Resp 18   Ht 5\' 4"  (1.626 m)   Wt 174 lb 3.2 oz (79 kg)   SpO2 99%   BMI 29.90 kg/m?  ? ?Wt Readings from Last 3 Encounters:  ?08/19/21 174 lb 3.2 oz (79 kg)  ?07/15/21 169 lb 9.6 oz (76.9 kg)  ?01/14/20 150 lb (68 kg)  ? ?Physical  Exam ?Constitutional:   ?   General: She is not in acute distress. ?   Appearance: Normal appearance. She is normal weight. She is not ill-appearing, toxic-appearing or diaphoretic.  ?HENT:  ?   Head: Normocephalic and atraumatic.  ?Genitourinary: ?   General: Normal vulva.  ?   Vagina: No vaginal discharge.  ?   Rectum: Guaiac result negative.  ?Skin: ?   Coloration: Skin is not jaundiced or pale.  ?   Findings: No bruising, erythema, lesion or rash.  ?Neurological:  ?   Mental Status: She is alert.  ?Psychiatric:     ?   Mood and Affect: Mood normal.     ?   Behavior: Behavior normal.     ?   Thought Content: Thought content normal.     ?   Judgment: Judgment normal.  ? ? ?No results found for any visits on 08/19/21. ? ? ? ?The ASCVD Risk score (Arnett DK, et al., 2019) failed to calculate for the following reasons: ?  Cannot find a previous HDL lab ?  Cannot find a previous total cholesterol lab ? ?  ?Assessment & Plan:  ? ?Problem List Items Addressed This Visit   ?None ?Visit Diagnoses   ? ? Papanicolaou smear    -  Primary  ? ?  ? ? ?No orders of the defined types were placed in this encounter. ? ? ?Return if symptoms worsen or fail to improve.  ? ?PLAN ?Pap w/ hpv sent. ?Return for CPE and labs next year. ?Labs collected. Will follow up with the patient as warranted. ?Patient encouraged to call clinic with any questions, comments, or concerns. ? ? ?Crystal Agee, NP ?

## 2021-08-19 NOTE — Patient Instructions (Addendum)
Crystal Villa -  ? ?Great to see you! ? ?Labs will be back in coming days. I'll let you know if results warrant concern ? ?Thanks, ? ?Rich  ? ? ? ?If you have lab work done today you will be contacted with your lab results within the next 2 weeks.  If you have not heard from [place] then please contact us. The fastest way to get your results is to register for My Chart. ? ? ?IF you received an x-ray today, you will receive an invoice from [facility]. Please contact [facility] at [phone] with questions or concerns regarding your invoice.  ? ?IF you received labwork today, you will receive an invoice from [facility]. Please contact [facility] at [phone] with questions or concerns regarding your invoice.  ? ?Our billing staff will not be able to assist you with questions regarding bills from these companies. ? ?You will be contacted with the lab results as soon as they are available. The fastest way to get your results is to activate your My Chart account. Instructions are located on the last page of this paperwork. If you have not heard from [place] regarding the results in 2 weeks, please contact this office. ?  ?  ?

## 2021-08-19 NOTE — Addendum Note (Signed)
Addended by: Maximiano Coss on: 08/19/2021 03:20 PM ? ? Modules accepted: Orders ? ?

## 2021-08-21 LAB — CYTOLOGY - PAP
Comment: NEGATIVE
High risk HPV: NEGATIVE

## 2021-08-24 LAB — HEPATITIS C ANTIBODY
Hepatitis C Ab: REACTIVE — AB
SIGNAL TO CUT-OFF: >11 — ABNORMAL HIGH (ref <1.00)

## 2021-08-24 LAB — HCV RNA,QUANTITATIVE REAL TIME PCR
HCV Quantitative Log: 6.84 Log IU/mL — ABNORMAL HIGH
HCV RNA, PCR, QN: 6970000 IU/mL — ABNORMAL HIGH

## 2021-08-24 LAB — HIV ANTIBODY (ROUTINE TESTING W REFLEX): HIV 1&2 Ab, 4th Generation: NONREACTIVE

## 2021-08-25 ENCOUNTER — Other Ambulatory Visit: Payer: Self-pay | Admitting: Registered Nurse

## 2021-08-25 DIAGNOSIS — B1920 Unspecified viral hepatitis C without hepatic coma: Secondary | ICD-10-CM

## 2021-09-09 ENCOUNTER — Other Ambulatory Visit (HOSPITAL_COMMUNITY): Payer: Self-pay

## 2021-09-09 ENCOUNTER — Telehealth: Payer: Self-pay

## 2021-09-09 NOTE — Telephone Encounter (Signed)
[facility] Patient Advocate Encounter  Insurance verification completed.    The patient is insured through [employer].  Medication will need a PA.  We will continue to follow to see if copay assistance is needed.  Izeyah Deike, CPhT [facility] Phone: [phone] Fax:  [phone]  

## 2021-09-10 ENCOUNTER — Encounter: Payer: Self-pay | Admitting: Family

## 2021-09-10 ENCOUNTER — Ambulatory Visit (INDEPENDENT_AMBULATORY_CARE_PROVIDER_SITE_OTHER): Payer: 59 | Admitting: Family

## 2021-09-10 ENCOUNTER — Other Ambulatory Visit: Payer: Self-pay

## 2021-09-10 VITALS — BP 118/83 | HR 82 | Temp 98.1°F | Resp 16 | Ht 63.5 in | Wt 166.8 lb

## 2021-09-10 DIAGNOSIS — B182 Chronic viral hepatitis C: Secondary | ICD-10-CM | POA: Diagnosis not present

## 2021-09-10 NOTE — Patient Instructions (Addendum)
Nice to see you.  We will check your lab work today.  Follow up 1 month after start of medication.  Have a great day and stay safe!  Limit acetaminophen (Tylenol) usage to no more than 2 grams (2,000 mg) per day.  Avoid alcohol.  Do not share toothbrushes or razors.  Practice safe sex to protect against transmission as well as sexually transmitted disease.    Hepatitis C Hepatitis C is a viral infection of the liver. It can lead to scarring of the liver (cirrhosis), liver failure, or liver cancer. Hepatitis C may go undetected for months or years because people with the infection may not have symptoms, or they may have only mild symptoms. What are the causes? This condition is caused by the hepatitis C virus (HCV). The virus can spread from person to person (is contagious) through: Blood. Childbirth. A woman who has hepatitis C can pass it to her baby during birth. Bodily fluids, such as breast milk, tears, semen, vaginal fluids, and saliva. Blood transfusions or organ transplants done in the [place] before 1992.  What increases the risk? The following factors may make you more likely to develop this condition: Having contact with unclean (contaminated) needles or syringes. This may result from: Acupuncture. Tattoing. Body piercing. Injecting drugs. Having unprotected sex with someone who is infected. Needing treatment to filter your blood (kidney dialysis). Having HIV (human immunodeficiency virus) or AIDS (acquired immunodeficiency syndrome). Working in a job that involves contact with blood or bodily fluids, such as health care.  What are the signs or symptoms? Symptoms of this condition include: Fatigue. Loss of appetite. Nausea. Vomiting. Abdominal pain. Dark yellow urine. Yellowish skin and eyes (jaundice). Itchy skin. Clay-colored bowel movements. Joint pain. Bleeding and bruising easily. Fluid building up in your stomach (ascites).  In some cases,  you may not have any symptoms. How is this diagnosed? This condition is diagnosed with: Blood tests. Other tests to check how well your liver is functioning. They may include: Magnetic resonance elastography (MRE). This imaging test uses MRIs and sound waves to measure liver stiffness. Transient elastography. This imaging test uses ultrasounds to measure liver stiffness. Liver biopsy. This test requires taking a small tissue sample from your liver to examine it under a microscope.  How is this treated? Your health care provider may perform noninvasive tests or a liver biopsy to help decide the best course of treatment. Treatment may include: Antiviral medicines and other medicines. Follow-up treatments every 6-12 months for infections or other liver conditions. Receiving a donated liver (liver transplant).  Follow these instructions at home: Medicines Take over-the-counter and prescription medicines only as told by your health care provider. Take your antiviral medicine as told by your health care provider. Do not stop taking the antiviral even if you start to feel better. Do not take any medicines unless approved by your health care provider, including over-the-counter medicines and birth control pills. Activity Rest as needed. Do not have sex unless approved by your health care provider. Ask your health care provider when you may return to school or work. Eating and drinking Eat a balanced diet with plenty of fruits and vegetables, whole grains, and lowfat (lean) meats or non-meat proteins (such as beans or tofu). Drink enough fluids to keep your urine clear or pale yellow. Do not drink alcohol. General instructions Do not share toothbrushes, nail clippers, or razors. Wash your hands frequently with soap and water. If soap and water are not available, use hand   sanitizer. Cover any cuts or open sores on your skin to prevent spreading the virus. Keep all follow-up visits as told by  your health care provider. This is important. You may need follow-up visits every 6-12 months. How is this prevented? There is no vaccine for hepatitis C. The only way to prevent the disease is to reduce the risk of exposure to the virus. Make sure you: Wash your hands frequently with soap and water. If soap and water are not available, use hand sanitizer. Do not share needles or syringes. Practice safe sex and use condoms. Avoid handling blood or bodily fluids without gloves or other protection. Avoid getting tattoos or piercings in shops or other locations that are not clean.  Contact a health care provider if: You have a fever. You develop abdominal pain. You pass dark urine. You pass clay-colored stools. You develop joint pain. Get help right away if: You have increasing fatigue or weakness. You lose your appetite. You cannot eat or drink without vomiting. You develop jaundice or your jaundice gets worse. You bruise or bleed easily. Summary Hepatitis C is a viral infection of the liver. It can lead to scarring of the liver (cirrhosis), liver failure, or liver cancer. The hepatitis C virus (HCV) causes this condition. The virus can pass from person to person (is contagious). You should not take any medicines unless approved by your health care provider. This includes over-the-counter medicines and birth control pills. This information is not intended to replace advice given to you by your health care provider. Make sure you discuss any questions you have with your health care provider. Document Released: 04/02/2000 Document Revised: 05/11/2016 Document Reviewed: 05/11/2016 Elsevier Interactive Patient Education  2018 [employer]  

## 2021-09-10 NOTE — Progress Notes (Signed)
Subjective:    Patient ID: Crystal Villa, female    DOB: 12/11/1960, 61 y.o.   MRN: [id number]  Chief Complaint  Patient presents with   New Patient (Initial Visit)    Hep C     HPI:  Crystal Villa is a 61 y.o. female with previous medical history of GERD and COPD presenting today for initial evaluation of Hepatitis C.   Crystal Villa was recently seen in her primary care office for Pap smear and screening was positive for Hepatitis C antibody with confirmatory Hepatitis C RNA level of 6.97 million IU/ml. AST and ALT normal with platelet count of 207. This was the first she has heard of Hepatitis C. Age and tattoo are primary risk factors. No treatment to date and denies abdominal pain, nausea, vomiting, fatigue, or scleral icterus. No personal or family history of liver disease. She is changing insurances in 1 month. No current recreational or illicit drug use, tobacco use or alcohol consumption.   No Known Allergies    Outpatient Medications Prior to Visit  Medication Sig Dispense Refill   albuterol (VENTOLIN HFA) 108 (90 Base) MCG/ACT inhaler Inhale into the lungs.     levalbuterol (XOPENEX HFA) 45 MCG/ACT inhaler Inhale 1-2 puffs into the lungs every 4 (four) hours as needed for wheezing. 15 g 0   pantoprazole (PROTONIX) 40 MG tablet Take 1 tablet (40 mg total) by mouth daily. 30 tablet 2   sucralfate (CARAFATE) 1 g tablet Take 1 tablet (1 g total) by mouth 3 (three) times daily as needed. 90 tablet 1   umeclidinium-vilanterol (ANORO ELLIPTA) 62.5-25 MCG/ACT AEPB INHALE 1 PUFF INTO THE LUNGS ONCE DAILY 60 each 2   benzonatate (TESSALON) 100 MG capsule Take 1 capsule by mouth every 8 (eight) hours for cough. (Patient not taking: Reported on 09/10/2021) 21 capsule 0   No facility-administered medications prior to visit.     Past Medical History:  Diagnosis Date   Arthritis    COPD (chronic obstructive pulmonary disease) (HCC)    GERD (gastroesophageal reflux  disease)       Past Surgical History:  Procedure Laterality Date   ABDOMINAL HYSTERECTOMY     BACK SURGERY     SPINE SURGERY        Family History  Problem Relation Age of Onset   Healthy Mother    Hyperlipidemia Father       Social History   Socioeconomic History   Marital status: Single    Spouse name: Not on file   Number of children: Not on file   Years of education: Not on file   Highest education level: Not on file  Occupational History   Not on file  Tobacco Use   Smoking status: Former    Packs/day: 1.00    Years: 15.00    Pack years: 15.00    Types: Cigarettes, Cigars    Quit date: 04/21/2017    Years since quitting: 4.3   Smokeless tobacco: Never  Vaping Use   Vaping Use: Never used  Substance and Sexual Activity   Alcohol use: Never   Drug use: Never   Sexual activity: Not Currently    Birth control/protection: None  Other Topics Concern   Not on file  Social History Narrative   Not on file   Social Determinants of Health   Financial Resource Strain: Not on file  Food Insecurity: Not on file  Transportation Needs: Not on file  Physical Activity: Not on file  Stress: Not on file  Social Connections: Not on file  Intimate Partner Violence: Not on file      Review of Systems  Constitutional:  Negative for chills, fatigue, fever and unexpected weight change.  Respiratory:  Negative for cough, chest tightness, shortness of breath and wheezing.   Cardiovascular:  Negative for chest pain and leg swelling.  Gastrointestinal:  Negative for abdominal distention, constipation, diarrhea, nausea and vomiting.  Neurological:  Negative for dizziness, weakness, light-headedness and headaches.  Hematological:  Does not bruise/bleed easily.      Objective:    BP 118/83   Pulse 82   Temp 98.1 F (36.7 C) (Oral)   Resp 16   Ht 5' 3.5" (1.613 m)   Wt 166 lb 12.8 oz (75.7 kg)   SpO2 96%   BMI 29.08 kg/m  Nursing note and vital signs  reviewed.  Physical Exam Constitutional:      General: She is not in acute distress.    Appearance: She is well-developed.  Cardiovascular:     Rate and Rhythm: Normal rate and regular rhythm.     Heart sounds: Normal heart sounds. No murmur heard.   No friction rub. No gallop.  Pulmonary:     Effort: Pulmonary effort is normal. No respiratory distress.     Breath sounds: Normal breath sounds. No wheezing or rales.  Chest:     Chest wall: No tenderness.  Abdominal:     General: Bowel sounds are normal. There is no distension.     Palpations: Abdomen is soft. There is no mass.     Tenderness: There is no abdominal tenderness. There is no guarding or rebound.  Skin:    General: Skin is warm and dry.  Neurological:     Mental Status: She is alert and oriented to person, place, and time.  Psychiatric:        Behavior: Behavior normal.        Thought Content: Thought content normal.        Judgment: Judgment normal.        Assessment & Plan:   Patient Active Problem List   Diagnosis Date Noted   Chronic hepatitis C without hepatic coma (HCC) 09/10/2021     Problem List Items Addressed This Visit       Digestive   Chronic hepatitis C without hepatic coma (HCC) - Primary    Crystal Villa is a 61 y/o caucasian female with chronic Hepatitis C with risk factors of age (born between 61-65) and tattoo. Treatment naive and asymptomatic. Reviewed the pathogenesis, risks if left untreated, lab work, treatment options, potential financial costs, and plan of care. Previous HIV negative. Check Hepatitis C lab work and Hepatitis B status. Plan for treatment with Mavyret pending lab work and insurance change. She will contact pharmacy staff with new insurance once active.        Relevant Orders   Hepatic function panel   Hepatitis B surface antibody,quantitative   Hepatitis B surface antigen   Hepatitis C genotype   Liver Fibrosis, FibroTest-ActiTest   Protime-INR     I am having  Crystal Villa maintain her benzonatate, albuterol, sucralfate, pantoprazole, Anoro Ellipta, and levalbuterol.   Follow-up: 1 month after starting medication or sooner if needed.     Marcos Eke, MSN, FNP-C Nurse Practitioner [facility] for Infectious Disease [facility] Main number: [phone]

## 2021-09-10 NOTE — Assessment & Plan Note (Signed)
Crystal Villa is a 61 y/o caucasian female with chronic Hepatitis C with risk factors of age (born between 25-65) and tattoo. Treatment naive and asymptomatic. Reviewed the pathogenesis, risks if left untreated, lab work, treatment options, potential financial costs, and plan of care. Previous HIV negative. Check Hepatitis C lab work and Hepatitis B status. Plan for treatment with Mavyret pending lab work and insurance change. She will contact pharmacy staff with new insurance once active.

## 2021-09-20 ENCOUNTER — Other Ambulatory Visit: Payer: Self-pay

## 2021-09-20 ENCOUNTER — Encounter: Payer: Self-pay | Admitting: *Deleted

## 2021-09-20 ENCOUNTER — Ambulatory Visit
Admission: EM | Admit: 2021-09-20 | Discharge: 2021-09-20 | Disposition: A | Discharge status: Home / Self Care | Payer: 59 | Attending: Family Medicine | Admitting: Family Medicine

## 2021-09-20 DIAGNOSIS — J069 Acute upper respiratory infection, unspecified: Secondary | ICD-10-CM

## 2021-09-20 DIAGNOSIS — J441 Chronic obstructive pulmonary disease with (acute) exacerbation: Secondary | ICD-10-CM

## 2021-09-20 MED ORDER — PREDNISONE 20 MG PO TABS
40.0000 mg | ORAL_TABLET | Freq: Every day | ORAL | 0 refills | Status: DC
Start: 2021-09-20 — End: 2022-01-01

## 2021-09-20 MED ORDER — PROMETHAZINE-DM 6.25-15 MG/5ML PO SYRP: 5.0000 mL | ORAL_SOLUTION | Freq: Four times a day (QID) | ORAL | 0 refills | Status: DC | PRN

## 2021-09-20 NOTE — ED Provider Notes (Signed)
[facility]    CSN: [id number] Arrival date & time: 09/20/21  [id number]      History   Chief Complaint Chief Complaint  Patient presents with   Cough   Facial Pain   Nasal Congestion    HPI Crystal Villa is a 61 y.o. female.   Presenting today with 3-day history of progressively worsening hacking productive cough, sinus drainage, sore throat, headache, fatigue.  Denies fever, chills, body aches, chest pain, abdominal pain, nausea vomiting or diarrhea.  Taking over-the-counter cold and congestion medications and typical COPD regimen of Anoro and as needed albuterol with minimal relief.  No known sick contacts recently.   Past Medical History:  Diagnosis Date   Arthritis    COPD (chronic obstructive pulmonary disease) (HCC)    GERD (gastroesophageal reflux disease)     Patient Active Problem List   Diagnosis Date Noted   Chronic hepatitis C without hepatic coma (HCC) 09/10/2021    Past Surgical History:  Procedure Laterality Date   ABDOMINAL HYSTERECTOMY     BACK SURGERY     SPINE SURGERY      OB History   No obstetric history on file.      Home Medications    Prior to Admission medications   Medication Sig Start Date End Date Taking? Authorizing Provider  predniSONE (DELTASONE) 20 MG tablet Take 2 tablets (40 mg total) by mouth daily with breakfast. 09/20/21  Yes Particia NearingLane, Janne Faulk Laraina, PA-C  promethazine-dextromethorphan (PROMETHAZINE-DM) 6.25-15 MG/5ML syrup Take 5 mLs by mouth 4 (four) times daily as needed. 09/20/21  Yes Particia NearingLane, Paddy Walthall Latarra, PA-C  albuterol (VENTOLIN HFA) 108 (90 Base) MCG/ACT inhaler Inhale into the lungs. 03/21/19   [provider]  benzonatate (TESSALON) 100 MG capsule Take 1 capsule by mouth every 8 (eight) hours for cough. Patient not taking: Reported on 09/10/2021 02/26/21   Mardella LaymanHagler, Brian, MD  levalbuterol [facility](XOPENEX HFA) 45 MCG/ACT inhaler Inhale 1-2 puffs into the lungs every 4 (four) hours as needed for  wheezing. 07/15/21   Janeece AgeeMorrow, Richard, NP  pantoprazole (PROTONIX) 40 MG tablet Take 1 tablet (40 mg total) by mouth daily. 07/15/21   Janeece AgeeMorrow, Richard, NP  sucralfate (CARAFATE) 1 g tablet Take 1 tablet (1 g total) by mouth 3 (three) times daily as needed. 06/08/21   Particia NearingLane, Joscelin Fray Lihanna, PA-C  umeclidinium-vilanterol [facility](ANORO ELLIPTA) 62.5-25 MCG/ACT AEPB INHALE 1 PUFF INTO THE LUNGS ONCE DAILY 07/15/21   Janeece AgeeMorrow, Richard, NP  cetirizine (ZYRTEC ALLERGY) 10 MG tablet Take 1 tablet (10 mg total) by mouth daily. 10/02/19 01/14/20  Avegno, Zachery DakinsKomlanvi S, FNP  fluticasone (FLONASE) 50 MCG/ACT nasal spray Place 1 spray into both nostrils daily for 14 days. 01/03/20 01/14/20  AvegnoZachery Dakins, Komlanvi S, FNP    Family History Family History  Problem Relation Age of Onset   Healthy Mother    Hyperlipidemia Father     Social History Social History   Tobacco Use   Smoking status: Former    Packs/day: 1.00    Years: 15.00    Pack years: 15.00    Types: Cigarettes, Cigars    Quit date: 04/21/2017    Years since quitting: 4.4   Smokeless tobacco: Never  Vaping Use   Vaping Use: Never used  Substance Use Topics   Alcohol use: Never   Drug use: Never     Allergies   Patient has no known allergies.   Review of Systems Review of Systems Per HPI  Physical Exam Triage Vital Signs ED Triage  Vitals  Enc Vitals Group     BP 09/20/21 1044 123/78     Pulse Rate 09/20/21 1044 94     Resp --      Temp 09/20/21 1044 98.7 F (37.1 C)     Temp src --      SpO2 09/20/21 1044 92 %     Weight --      Height --      Head Circumference --      Peak Flow --      Pain Score 09/20/21 1045 0     Pain Loc --      Pain Edu? --      Excl. in GC? --    No data found.  Updated Vital Signs BP 123/78   Pulse 94   Temp 98.7 F (37.1 C)   SpO2 92%   Visual Acuity Right Eye Distance:   Left Eye Distance:   Bilateral Distance:    Right Eye Near:   Left Eye Near:    Bilateral Near:     Physical Exam Vitals  and nursing note reviewed.  Constitutional:      Appearance: Normal appearance. She is not ill-appearing.  HENT:     Head: Atraumatic.     Nose: Rhinorrhea present.     Mouth/Throat:     Mouth: Mucous membranes are moist.     Pharynx: Posterior oropharyngeal erythema present.  Eyes:     Extraocular Movements: Extraocular movements intact.     Conjunctiva/sclera: Conjunctivae normal.  Cardiovascular:     Rate and Rhythm: Normal rate and regular rhythm.     Heart sounds: Normal heart sounds.  Pulmonary:     Effort: Pulmonary effort is normal.     Breath sounds: Wheezing present. No rales.  Musculoskeletal:        General: Normal range of motion.     Cervical back: Normal range of motion and neck supple.  Skin:    General: Skin is warm and dry.  Neurological:     Mental Status: She is alert and oriented to person, place, and time.  Psychiatric:        Mood and Affect: Mood normal.        Thought Content: Thought content normal.        Judgment: Judgment normal.     UC Treatments / Results  Labs (all labs ordered are listed, but only abnormal results are displayed) Labs Reviewed  COVID-19, FLU A+B NAA    EKG   Radiology No results found.  Procedures Procedures (including critical care time)  Medications Ordered in UC Medications - No data to display  Initial Impression / Assessment and Plan / UC Course  I have reviewed the triage vital signs and the nursing notes.  Pertinent labs & imaging results that were available during my care of the patient were reviewed by me and considered in my medical decision making (see chart for details).     COVID and flu pending, will treat with prednisone, Phenergan DM and inhaler regimen for suspected COPD exacerbation secondary to viral upper respiratory infection.  Discussed abortive care and return precautions.  Final Clinical Impressions(s) / UC Diagnoses   Final diagnoses:  Viral URI with cough  COPD exacerbation [facility])    Discharge Instructions   None    ED Prescriptions     Medication Sig Dispense Auth. Provider   predniSONE (DELTASONE) 20 MG tablet Take 2 tablets (40 mg total) by mouth daily with breakfast. 10  tablet Keyaira, [place], [place]   promethazine-dextromethorphan (PROMETHAZINE-DM) 6.25-15 MG/5ML syrup Take 5 mLs by mouth 4 (four) times daily as needed. 100 mL Particia Nearing, [place]      PDMP not reviewed this encounter.   Pyper, [place], [place] 09/20/21 1240

## 2021-09-20 NOTE — ED Triage Notes (Signed)
Pt reports cough,sinus problem, HA since Thursday.

## 2021-09-21 ENCOUNTER — Other Ambulatory Visit (HOSPITAL_COMMUNITY): Payer: Self-pay

## 2021-09-21 LAB — LIVER FIBROSIS, FIBROTEST-ACTITEST
ALT: 20 U/L (ref 6–29)
Alpha-2-Macroglobulin: 287 mg/dL — ABNORMAL HIGH (ref 106–279)
Apolipoprotein A1: 166 mg/dL (ref 101–198)
Bilirubin: 0.4 mg/dL (ref 0.2–1.2)
Fibrosis Score: 0.19
GGT: 13 U/L (ref 3–70)
Haptoglobin: 142 mg/dL (ref 43–212)
Necroinflammat ACT Score: 0.07
Reference ID: 4393291

## 2021-09-21 LAB — PROTIME-INR
INR: 1
Prothrombin Time: 10.5 s (ref 9.0–11.5)

## 2021-09-21 LAB — HEPATIC FUNCTION PANEL
AG Ratio: 1.7 (calc) (ref 1.0–2.5)
ALT: 22 U/L (ref 6–29)
AST: 27 U/L (ref 10–35)
Albumin: 4.3 g/dL (ref 3.6–5.1)
Alkaline phosphatase (APISO): 94 U/L (ref 37–153)
Bilirubin, Direct: 0.1 mg/dL (ref 0.0–0.2)
Globulin: 2.5 g/dL (calc) (ref 1.9–3.7)
Indirect Bilirubin: 0.2 mg/dL (calc) (ref 0.2–1.2)
Total Bilirubin: 0.3 mg/dL (ref 0.2–1.2)
Total Protein: 6.8 g/dL (ref 6.1–8.1)

## 2021-09-21 LAB — HEPATITIS C GENOTYPE: HCV Genotype: 2

## 2021-09-21 LAB — HEPATITIS B SURFACE ANTIGEN: Hepatitis B Surface Ag: NONREACTIVE

## 2021-09-21 LAB — HEPATITIS B SURFACE ANTIBODY, QUANTITATIVE: Hep B S AB Quant (Post): <5 m[IU]/mL — ABNORMAL LOW (ref >=10)

## 2021-09-22 ENCOUNTER — Telehealth: Payer: Self-pay

## 2021-09-22 LAB — COVID-19, FLU A+B NAA
Influenza A, NAA: NOT DETECTED
Influenza B, NAA: NOT DETECTED
SARS-CoV-2, NAA: NOT DETECTED

## 2021-09-22 NOTE — Telephone Encounter (Signed)
[facility] Patient Advocate Encounter   Received notification from [facility] that prior authorization for Mavyret is required.   PA submitted on 09/22/21 Key [id number] Status is pending    [facility] will continue to follow.   Clearance Coots, CPhT Specialty Pharmacy Patient [facility] for Infectious Disease Phone: [phone] Fax:  [phone]

## 2021-09-29 ENCOUNTER — Telehealth: Payer: Self-pay

## 2021-09-29 ENCOUNTER — Other Ambulatory Visit (HOSPITAL_COMMUNITY): Payer: Self-pay

## 2021-09-29 NOTE — Telephone Encounter (Signed)
[facility] Patient Advocate Encounter   Received notification from [facility] that prior authorization for epclusa is required.   PA submitted on 09/29/21 Key [id number] Status is pending    [facility] will continue to follow.   Ileene Patrick, [place] Specialty Pharmacy Patient [facility] for Infectious Disease Phone: [phone] Fax:  [phone]

## 2021-10-01 ENCOUNTER — Other Ambulatory Visit (HOSPITAL_COMMUNITY): Payer: Self-pay

## 2021-10-08 ENCOUNTER — Other Ambulatory Visit (HOSPITAL_COMMUNITY): Payer: Self-pay

## 2021-10-12 ENCOUNTER — Other Ambulatory Visit (HOSPITAL_COMMUNITY): Payer: Self-pay

## 2021-10-12 ENCOUNTER — Other Ambulatory Visit: Payer: Self-pay | Admitting: Registered Nurse

## 2021-10-12 DIAGNOSIS — K219 Gastro-esophageal reflux disease without esophagitis: Secondary | ICD-10-CM

## 2021-10-13 ENCOUNTER — Telehealth: Payer: Self-pay

## 2021-10-13 ENCOUNTER — Other Ambulatory Visit: Payer: Self-pay | Admitting: Pharmacist

## 2021-10-13 ENCOUNTER — Other Ambulatory Visit (HOSPITAL_COMMUNITY): Payer: Self-pay

## 2021-10-13 DIAGNOSIS — B182 Chronic viral hepatitis C: Secondary | ICD-10-CM

## 2021-10-13 MED ORDER — SOFOSBUVIR-VELPATASVIR 400-100 MG PO TABS: 1.0000 | ORAL_TABLET | Freq: Every day | ORAL | 2 refills | Status: DC

## 2021-10-13 NOTE — Telephone Encounter (Addendum)
[facility] Patient Advocate Encounter  Prior Authorization for [employer] (Brand Name) has been approved.    PA# [id number] Effective dates: 10/13/21 through 01/13/22  [facility]  Copay Card     [facility] will continue to follow.  Clearance Coots, CPhT Specialty Pharmacy Patient [facility] for Infectious Disease Phone: [phone] Fax:  [phone]

## 2021-10-21 ENCOUNTER — Other Ambulatory Visit (HOSPITAL_COMMUNITY): Payer: Self-pay

## 2021-10-23 ENCOUNTER — Other Ambulatory Visit (HOSPITAL_COMMUNITY): Payer: Self-pay

## 2021-10-23 ENCOUNTER — Telehealth: Payer: Self-pay

## 2021-10-23 ENCOUNTER — Other Ambulatory Visit: Payer: Self-pay | Admitting: Pharmacist

## 2021-10-23 DIAGNOSIS — B182 Chronic viral hepatitis C: Secondary | ICD-10-CM

## 2021-10-23 MED ORDER — MAVYRET 100-40 MG PO TABS: 3.0000 | ORAL_TABLET | Freq: Every day | ORAL | 1 refills | Status: DC

## 2021-10-23 NOTE — Telephone Encounter (Signed)
[facility] Patient Advocate Encounter  Prior Authorization for Mavyret has been approved.    PA# [id number] Effective dates: 10/22/21 through 12/17/21  Prescription will need to be filled at [facility] # [phone] Fax # [phone].   [facility] will continue to follow.  Clearance Coots, CPhT Specialty Pharmacy Patient [facility] for Infectious Disease Phone: [phone] Fax:  [phone]

## 2021-10-26 ENCOUNTER — Telehealth: Payer: Self-pay | Admitting: Pharmacist

## 2021-10-26 NOTE — Telephone Encounter (Signed)
Patient is approved to receive Mavyret x 8 weeks for chronic Hepatitis C infection. Counseled patient to take all three tablets of Mavyret daily with food.  Counseled patient the need to take all three tablets together and to not separate them out during the day. Encouraged patient not to miss any doses and explained how their chance of cure could go down with each dose missed. Counseled patient on what to do if dose is missed - if it is closer to the missed dose take immediately; if closer to next dose then skip dose and take the next dose at the usual time. Counseled patient on common side effects such as headache, fatigue, and nausea and that these normally decrease with time. I reviewed patient medications and found no drug interactions. Discussed with patient that there are several drug interactions with Mavyret and instructed patient to call the clinic if she wishes to start a new medication during course of therapy. Also advised patient to call if she experiences any side effects. Patient will follow-up with me in the pharmacy clinic on 11/27/21.  Margarite Gouge, PharmD, CPP Clinical Pharmacist Practitioner Infectious Diseases Clinical Pharmacist [facility] for Infectious Disease

## 2021-11-09 ENCOUNTER — Telehealth: Payer: Self-pay

## 2021-11-09 NOTE — Telephone Encounter (Signed)
Received call from [facility] with [facility], states they have been unable to reach the patient and have reached their maximum number of attempts allowed to contact her. Will make pharmacy aware and message patient. [facility]irmed [facility] number on [facility].   Sandie Ano, RN

## 2021-11-15 ENCOUNTER — Other Ambulatory Visit: Payer: Self-pay | Admitting: Registered Nurse

## 2021-11-15 DIAGNOSIS — J431 Panlobular emphysema: Secondary | ICD-10-CM

## 2021-11-25 ENCOUNTER — Encounter: Payer: Self-pay | Admitting: Pharmacist

## 2021-11-25 ENCOUNTER — Other Ambulatory Visit (HOSPITAL_COMMUNITY): Payer: Self-pay

## 2021-11-27 ENCOUNTER — Other Ambulatory Visit: Payer: Self-pay

## 2021-11-27 ENCOUNTER — Ambulatory Visit (INDEPENDENT_AMBULATORY_CARE_PROVIDER_SITE_OTHER): Payer: BC Managed Care – PPO | Admitting: Pharmacist

## 2021-11-27 DIAGNOSIS — B182 Chronic viral hepatitis C: Secondary | ICD-10-CM

## 2021-11-27 NOTE — Progress Notes (Signed)
HPI: Crystal Villa is a 61 y.o. female who presents to the [facility] pharmacy clinic for Hepatitis C follow-up.  Medication: Mavyret x 8 weeks  Start Date: 10/30/21  Hepatitis C Genotype: 2  Fibrosis Score: F0  Hepatitis C RNA: 6.97 million (08/19/21)  Patient Active Problem List   Diagnosis Date Noted   Chronic hepatitis C without hepatic coma (HCC) 09/10/2021    Patient's Medications  New Prescriptions   No medications on file  Previous Medications   ALBUTEROL (VENTOLIN HFA) 108 (90 BASE) MCG/ACT INHALER    Inhale into the lungs.   ANORO ELLIPTA 62.5-25 MCG/ACT AEPB    INHALE 1 PUFF INTO THE LUNGS ONCE DAILY.   BENZONATATE (TESSALON) 100 MG CAPSULE    Take 1 capsule by mouth every 8 (eight) hours for cough.   GLECAPREVIR-PIBRENTASVIR (MAVYRET) 100-40 MG TABS    Take 3 tablets by mouth daily with breakfast.   LEVALBUTEROL (XOPENEX HFA) 45 MCG/ACT INHALER    Inhale 1-2 puffs into the lungs every 4 (four) hours as needed for wheezing.   PANTOPRAZOLE (PROTONIX) 40 MG TABLET    TAKE 1 TABLET BY MOUTH EVERY DAY   PREDNISONE (DELTASONE) 20 MG TABLET    Take 2 tablets (40 mg total) by mouth daily with breakfast.   PROMETHAZINE-DEXTROMETHORPHAN (PROMETHAZINE-DM) 6.25-15 MG/5ML SYRUP    Take 5 mLs by mouth 4 (four) times daily as needed.   SUCRALFATE (CARAFATE) 1 G TABLET    Take 1 tablet (1 g total) by mouth 3 (three) times daily as needed.  Modified Medications   No medications on file  Discontinued Medications   No medications on file    Allergies: No Known Allergies  Past Medical History: Past Medical History:  Diagnosis Date   Arthritis    COPD (chronic obstructive pulmonary disease) (HCC)    GERD (gastroesophageal reflux disease)     Social History: Social History   Socioeconomic History   Marital status: Single    Spouse name: Not on file   Number of children: Not on file   Years of education: Not on file   Highest education level: Not on file  Occupational  History   Not on file  Tobacco Use   Smoking status: Former    Packs/day: 1.00    Years: 15.00    Total pack years: 15.00    Types: Cigarettes, Cigars    Quit date: 04/21/2017    Years since quitting: 4.6   Smokeless tobacco: Never  Vaping Use   Vaping Use: Never used  Substance and Sexual Activity   Alcohol use: Never   Drug use: Never   Sexual activity: Not Currently    Birth control/protection: None  Other Topics Concern   Not on file  Social History Narrative   Not on file   Social Determinants of Health   Financial Resource Strain: Not on file  Food Insecurity: Not on file  Transportation Needs: Not on file  Physical Activity: Not on file  Stress: Not on file  Social Connections: Not on file    Labs: Hepatitis C Lab Results  Component Value Date   HCVGENOTYPE 2 09/10/2021   HEPCAB REACTIVE (A) 08/19/2021   HCVRNAPCRQN 6,970,000 (H) 08/19/2021   FIBROSTAGE F0 09/10/2021   Hepatitis B Lab Results  Component Value Date   HEPBSAG NON-REACTIVE 09/10/2021   Hepatitis A No results found for: "HAV" HIV Lab Results  Component Value Date   HIV NON-REACTIVE 08/19/2021   Lab Results  Component Value  Date   CREATININE 0.49 08/19/2021   CREATININE 0.40 (L) 08/08/2017   Lab Results  Component Value Date   AST 27 09/10/2021   AST 26 08/19/2021   ALT 22 09/10/2021   ALT 20 09/10/2021   ALT 22 08/19/2021   INR 1.0 09/10/2021    Assessment: Crystal Villa presents to clinic today for HCV follow-up. She has been taking Mavyret for 4 weeks and has only missed 2 doses. States overall she is tolerating it but has experienced intermittent nausea and headaches. States she has been taking Excedrin for headaches; reviewed to avoid products that contain Tylenol. Recommended she ask a local pharmacy to help her find aspirin or even caffeine tablets to prevent headaches if they seem to help. She has also found relief with Goody powder. States she has not taken anything for the  nausea. Recommended she may benefit from something non-drowsy like meclizine. She works in a [employer] and states it is frequently hot in the building which may increase her headaches and nausea. Will check HCV RNA today and follow-up in 4 weeks with Tammy Sours at end of treatment.   Plan: Check HCV RNA Continue Mavyret Follow-up with Tammy Sours in 4 weeks  Margarite Gouge, PharmD, CPP, BCIDP Clinical Pharmacist Practitioner Infectious Diseases Clinical Pharmacist [facility] 11/27/2021, 2:23 PM

## 2021-11-27 NOTE — Patient Instructions (Signed)
For Nausea: Meclizine (non-drowsy Dramamine) For headaches: aspirin and caffeine pills (please avoid Excedrin and Tylenol)

## 2021-11-30 LAB — HEPATITIS C RNA QUANTITATIVE
HCV Quantitative Log: 2.67 log IU/mL — ABNORMAL HIGH
HCV RNA, PCR, QN: 471 IU/mL — ABNORMAL HIGH

## 2021-12-01 NOTE — Progress Notes (Signed)
FYI - detectable at one month in for Mavyret. States she only missed 2 doses. To see you next month. Marchelle Folks

## 2021-12-03 ENCOUNTER — Telehealth: Payer: Self-pay | Admitting: Gastroenterology

## 2021-12-03 NOTE — Telephone Encounter (Signed)
Hi Dr. Lavon Paganini,   We received a referral for patient to have a colonoscopy she had one done back in 2015 records were obtained for you to review and advise on scheduling.    Thank you

## 2021-12-17 ENCOUNTER — Encounter: Payer: Self-pay | Admitting: Emergency Medicine

## 2021-12-17 ENCOUNTER — Ambulatory Visit
Admission: EM | Admit: 2021-12-17 | Discharge: 2021-12-17 | Disposition: A | Discharge status: Home / Self Care | Payer: BC Managed Care – PPO | Attending: Family Medicine | Admitting: Family Medicine

## 2021-12-17 ENCOUNTER — Other Ambulatory Visit: Payer: Self-pay

## 2021-12-17 DIAGNOSIS — J069 Acute upper respiratory infection, unspecified: Secondary | ICD-10-CM | POA: Insufficient documentation

## 2021-12-17 DIAGNOSIS — Z79899 Other long term (current) drug therapy: Secondary | ICD-10-CM | POA: Insufficient documentation

## 2021-12-17 DIAGNOSIS — J449 Chronic obstructive pulmonary disease, unspecified: Secondary | ICD-10-CM | POA: Insufficient documentation

## 2021-12-17 DIAGNOSIS — Z20822 Contact with and (suspected) exposure to covid-19: Secondary | ICD-10-CM | POA: Diagnosis not present

## 2021-12-17 DIAGNOSIS — R059 Cough, unspecified: Secondary | ICD-10-CM | POA: Diagnosis not present

## 2021-12-17 HISTORY — DX: Unspecified viral hepatitis C without hepatic coma: B19.20

## 2021-12-17 LAB — RESP PANEL BY RT-PCR (FLU A&B, COVID) ARPGX2
Influenza A by PCR: NEGATIVE
Influenza B by PCR: NEGATIVE
SARS Coronavirus 2 by RT PCR: NEGATIVE

## 2021-12-17 MED ORDER — FLUTICASONE PROPIONATE 50 MCG/ACT NA SUSP
1.0000 | Freq: Two times a day (BID) | NASAL | 2 refills | Status: DC
Start: 2021-12-17 — End: 2024-01-10

## 2021-12-17 MED ORDER — BENZONATATE 100 MG PO CAPS: ORAL_CAPSULE | ORAL | 0 refills | Status: DC

## 2021-12-17 NOTE — Discharge Instructions (Signed)
Take Coricidin HBP, Flonase, antihistamines, plain Mucinex

## 2021-12-17 NOTE — ED Triage Notes (Signed)
Pt reports sore throat, cough, headache, nasal congestion,facial pressure. Denies any known fevers.

## 2021-12-17 NOTE — ED Provider Notes (Signed)
[facility]    CSN: [id number] Arrival date & time: 12/17/21  0806      History   Chief Complaint Chief Complaint  Patient presents with   Sore Throat    HPI Crystal Villa is a 61 y.o. female.   Patient presenting today with 1 day history of sore throat, cough, headache, congestion, facial pain and pressure, chills, sweats.  Denies chest pain, shortness of breath, abdominal pain, nausea vomiting or diarrhea.  So far trying Tylenol Cold and sinus with no relief.  Compliant with her inhaler regimen for COPD.  No known sick contacts recently but does work around a lot of people.    Past Medical History:  Diagnosis Date   Arthritis    COPD (chronic obstructive pulmonary disease) (HCC)    GERD (gastroesophageal reflux disease)    Hepatitis C     Patient Active Problem List   Diagnosis Date Noted   Chronic hepatitis C without hepatic coma (HCC) 09/10/2021    Past Surgical History:  Procedure Laterality Date   ABDOMINAL HYSTERECTOMY     BACK SURGERY     SPINE SURGERY      OB History   No obstetric history on file.      Home Medications    Prior to Admission medications   Medication Sig Start Date End Date Taking? Authorizing Provider  albuterol (VENTOLIN HFA) 108 (90 Base) MCG/ACT inhaler Inhale into the lungs. 03/21/19  Yes [provider]  ANORO ELLIPTA 62.5-25 MCG/ACT AEPB INHALE 1 PUFF INTO THE LUNGS ONCE DAILY. 11/16/21  Yes Janeece Agee, NP  fluticasone (FLONASE) 50 MCG/ACT nasal spray Place 1 spray into both nostrils 2 (two) times daily. 12/17/21  Yes Particia Nearing, PA-C  Glecaprevir-Pibrentasvir (MAVYRET) 100-40 MG TABS Take 3 tablets by mouth daily with breakfast. Patient taking differently: Take 3 tablets by mouth at bedtime. 10/23/21  Yes Jennette Kettle, RPH-CPP  pantoprazole (PROTONIX) 40 MG tablet TAKE 1 TABLET BY MOUTH EVERY DAY 10/12/21  Yes Janeece Agee, NP  sucralfate (CARAFATE) 1 g tablet Take 1 tablet (1 g total)  by mouth 3 (three) times daily as needed. 06/08/21  Yes Particia Nearing, PA-C  benzonatate (TESSALON) 100 MG capsule Take 1 capsule by mouth every 8 (eight) hours for cough. 12/17/21   Particia Nearing, PA-C  levalbuterol [facility] HFA) 45 MCG/ACT inhaler Inhale 1-2 puffs into the lungs every 4 (four) hours as needed for wheezing. 07/15/21   Janeece Agee, NP  predniSONE (DELTASONE) 20 MG tablet Take 2 tablets (40 mg total) by mouth daily with breakfast. 09/20/21   Particia Nearing, PA-C  promethazine-dextromethorphan (PROMETHAZINE-DM) 6.25-15 MG/5ML syrup Take 5 mLs by mouth 4 (four) times daily as needed. 09/20/21   Particia Nearing, PA-C  cetirizine (ZYRTEC ALLERGY) 10 MG tablet Take 1 tablet (10 mg total) by mouth daily. 10/02/19 01/14/20  AvegnoZachery Dakins, FNP    Family History Family History  Problem Relation Age of Onset   Healthy Mother    Hyperlipidemia Father     Social History Social History   Tobacco Use   Smoking status: Former    Packs/day: 1.00    Years: 15.00    Total pack years: 15.00    Types: Cigarettes, Cigars    Quit date: 04/21/2017    Years since quitting: 4.6   Smokeless tobacco: Never  Vaping Use   Vaping Use: Never used  Substance Use Topics   Alcohol use: Never   Drug use: Never  Allergies   Patient has no known allergies.   Review of Systems Review of Systems Per HPI  Physical Exam Triage Vital Signs ED Triage Vitals  Enc Vitals Group     BP 12/17/21 0817 (!) 143/92     Pulse Rate 12/17/21 0817 89     Resp 12/17/21 0817 20     Temp 12/17/21 0817 98 F (36.7 C)     Temp Source 12/17/21 0817 Oral     SpO2 12/17/21 0817 95 %     Weight --      Height --      Head Circumference --      Peak Flow --      Pain Score 12/17/21 0818 4     Pain Loc --      Pain Edu? --      Excl. in GC? --    No data found.  Updated Vital Signs BP (!) 143/92 (BP Location: Right Arm)   Pulse 89   Temp 98 F (36.7 C) (Oral)    Resp 20   SpO2 95%   Visual Acuity Right Eye Distance:   Left Eye Distance:   Bilateral Distance:    Right Eye Near:   Left Eye Near:    Bilateral Near:     Physical Exam Vitals and nursing note reviewed.  Constitutional:      Appearance: Normal appearance. She is not ill-appearing.  HENT:     Head: Atraumatic.     Right Ear: Tympanic membrane and external ear normal.     Left Ear: Tympanic membrane and external ear normal.     Nose: Rhinorrhea present.     Mouth/Throat:     Mouth: Mucous membranes are moist.     Pharynx: Posterior oropharyngeal erythema present.  Eyes:     Extraocular Movements: Extraocular movements intact.     Conjunctiva/sclera: Conjunctivae normal.  Cardiovascular:     Rate and Rhythm: Normal rate and regular rhythm.     Heart sounds: Normal heart sounds.  Pulmonary:     Effort: Pulmonary effort is normal.     Breath sounds: Normal breath sounds. No wheezing or rales.  Musculoskeletal:        General: Normal range of motion.     Cervical back: Normal range of motion and neck supple.  Skin:    General: Skin is warm and dry.  Neurological:     Mental Status: She is alert and oriented to person, place, and time.  Psychiatric:        Mood and Affect: Mood normal.        Thought Content: Thought content normal.        Judgment: Judgment normal.      UC Treatments / Results  Labs (all labs ordered are listed, but only abnormal results are displayed) Labs Reviewed  RESP PANEL BY RT-PCR (FLU A&B, COVID) [id number]    EKG   Radiology No results found.  Procedures Procedures (including critical care time)  Medications Ordered in UC Medications - No data to display  Initial Impression / Assessment and Plan / UC Course  I have reviewed the triage vital signs and the nursing notes.  Pertinent labs & imaging results that were available during my care of the patient were reviewed by me and considered in my medical decision making (see chart for  details).     Suspect viral upper respiratory infection, exam and vital signs overall reassuring.  Respiratory panel pending, treat with Flonase, Tessalon,  Coricidin HBP and other safe over-the-counter remedies and continue inhaler regimen.  Return for worsening symptoms.  If COVID-positive, consider molnupiravir.  Final Clinical Impressions(s) / UC Diagnoses   Final diagnoses:  Viral URI with cough     Discharge Instructions      Take Coricidin HBP, Flonase, antihistamines, plain Mucinex    ED Prescriptions     Medication Sig Dispense Auth. Provider   benzonatate (TESSALON) 100 MG capsule Take 1 capsule by mouth every 8 (eight) hours for cough. 21 capsule Elias, [place], PA-C   fluticasone [facility]) 50 MCG/ACT nasal spray Place 1 spray into both nostrils 2 (two) times daily. 16 g Particia Nearing, [place]      PDMP not reviewed this encounter.   Amillia, [place], [place] 12/17/21 1439

## 2022-01-01 ENCOUNTER — Encounter: Payer: Self-pay | Admitting: Family

## 2022-01-01 ENCOUNTER — Ambulatory Visit: Payer: BC Managed Care – PPO | Admitting: Family

## 2022-01-01 ENCOUNTER — Other Ambulatory Visit: Payer: Self-pay

## 2022-01-01 VITALS — BP 138/88 | HR 75 | Temp 97.3°F | Ht 64.0 in | Wt 166.0 lb

## 2022-01-01 DIAGNOSIS — B182 Chronic viral hepatitis C: Secondary | ICD-10-CM | POA: Diagnosis not present

## 2022-01-01 NOTE — Patient Instructions (Addendum)
Nice to see you.  We will check your lab work today.  Plan for follow up in 3 months or sooner if needed with lab work on the same day.  Have a great day and stay safe!  

## 2022-01-01 NOTE — Progress Notes (Signed)
Subjective:    Patient ID: Crystal Villa, female    DOB: 1960-11-07, 61 y.o.   MRN: [id number]  Chief Complaint  Patient presents with   Follow-up   Hepatitis C    HPI:  Crystal Villa is a 61 y.o. female with chronic Hepatitis C last seen by Margarite Gouge, PharmD, CPP following completion of 1st month of Mavyret. Hepatitis C RNA level was 471. Here today for end of treatment follow up.   Ms. Rung completed her Mavyret on 9/10 with good adherence and tolerance to the medication. Has noticed a few small white spots on a couple of her nails and is curious if that is related to medication. Otherwise feeling well with no new concerns/complaints. Denies abdominal pain, nausea, vomiting, fatigue, fever, scleral icterus or jaundice.    No Known Allergies    Outpatient Medications Prior to Visit  Medication Sig Dispense Refill   albuterol (VENTOLIN HFA) 108 (90 Base) MCG/ACT inhaler Inhale into the lungs.     ANORO ELLIPTA 62.5-25 MCG/ACT AEPB INHALE 1 PUFF INTO THE LUNGS ONCE DAILY. 60 each 2   benzonatate (TESSALON) 100 MG capsule Take 1 capsule by mouth every 8 (eight) hours for cough. 21 capsule 0   fluticasone (FLONASE) 50 MCG/ACT nasal spray Place 1 spray into both nostrils 2 (two) times daily. 16 g 2   levalbuterol (XOPENEX HFA) 45 MCG/ACT inhaler Inhale 1-2 puffs into the lungs every 4 (four) hours as needed for wheezing. 15 g 0   pantoprazole (PROTONIX) 40 MG tablet TAKE 1 TABLET BY MOUTH EVERY DAY 90 tablet 1   promethazine-dextromethorphan (PROMETHAZINE-DM) 6.25-15 MG/5ML syrup Take 5 mLs by mouth 4 (four) times daily as needed. 100 mL 0   sucralfate (CARAFATE) 1 g tablet Take 1 tablet (1 g total) by mouth 3 (three) times daily as needed. 90 tablet 1   Glecaprevir-Pibrentasvir (MAVYRET) 100-40 MG TABS Take 3 tablets by mouth daily with breakfast. (Patient taking differently: Take 3 tablets by mouth at bedtime.) 84 tablet 1   predniSONE (DELTASONE) 20 MG tablet Take 2 tablets  (40 mg total) by mouth daily with breakfast. 10 tablet 0   No facility-administered medications prior to visit.     Past Medical History:  Diagnosis Date   Arthritis    COPD (chronic obstructive pulmonary disease) (HCC)    GERD (gastroesophageal reflux disease)    Hepatitis C      Past Surgical History:  Procedure Laterality Date   ABDOMINAL HYSTERECTOMY     BACK SURGERY     SPINE SURGERY         Review of Systems  Constitutional:  Negative for chills, fatigue, fever and unexpected weight change.  Respiratory:  Negative for cough, chest tightness, shortness of breath and wheezing.   Cardiovascular:  Negative for chest pain and leg swelling.  Gastrointestinal:  Negative for abdominal distention, constipation, diarrhea, nausea and vomiting.  Neurological:  Negative for dizziness, weakness, light-headedness and headaches.  Hematological:  Does not bruise/bleed easily.      Objective:    BP 138/88   Pulse 75   Temp (!) 97.3 F (36.3 C) (Temporal)   Ht 5\' 4"  (1.626 m)   Wt 166 lb (75.3 kg)   BMI 28.49 kg/m  Nursing note and vital signs reviewed.  Physical Exam Constitutional:      General: She is not in acute distress.    Appearance: She is well-developed.  Cardiovascular:     Rate and Rhythm: Normal rate and regular  rhythm.     Heart sounds: Normal heart sounds. No murmur heard.    No friction rub. No gallop.  Pulmonary:     Effort: Pulmonary effort is normal. No respiratory distress.     Breath sounds: Normal breath sounds. No wheezing or rales.  Chest:     Chest wall: No tenderness.  Abdominal:     General: Bowel sounds are normal. There is no distension.     Palpations: Abdomen is soft. There is no mass.     Tenderness: There is no abdominal tenderness. There is no guarding or rebound.  Skin:    General: Skin is warm and dry.  Neurological:     Mental Status: She is alert and oriented to person, place, and time.  Psychiatric:        Behavior:  Behavior normal.        Thought Content: Thought content normal.        Judgment: Judgment normal.         01/01/2022   10:14 AM 09/10/2021    8:45 AM 08/19/2021    3:02 PM 07/15/2021    4:00 PM  Depression screen PHQ 2/9  Decreased Interest 0 0 0 0  Down, Depressed, Hopeless 0 0 0 0  PHQ - 2 Score 0 0 0 0  Altered sleeping   0 0  Tired, decreased energy   0 0  Change in appetite   0 0  Feeling bad or failure about yourself    0 0  Trouble concentrating   0 0  Moving slowly or fidgety/restless   0 0  Suicidal thoughts   0 0  PHQ-9 Score   0 0  Difficult doing work/chores   Not difficult at all Not difficult at all       Assessment & Plan:    Patient Active Problem List   Diagnosis Date Noted   Chronic hepatitis C without hepatic coma (HCC) 09/10/2021     Problem List Items Addressed This Visit       Digestive   Chronic hepatitis C without hepatic coma (HCC) - Primary    Crystal Villa has completed her 8 weeks of treatment with Mavyret for chronic Hepatitis C. Check Hepatitis C RNA level today. Discussed plan of care if Hepatitis C RNA is undetectable will recheck in 3 months for SVR12. No additional screening for hepatocellular carcinoma is needed.       Relevant Orders   Hepatitis C RNA quantitative     I have discontinued [employer]. I am also having her maintain her albuterol, sucralfate, levalbuterol, predniSONE, promethazine-dextromethorphan, pantoprazole, Anoro Ellipta, benzonatate, and fluticasone.    Follow-up: Return in about 3 months (around 04/02/2022), or if symptoms worsen or fail to improve.   Marcos Eke, MSN, FNP-C Nurse Practitioner [facility] Main number: [phone]

## 2022-01-01 NOTE — Assessment & Plan Note (Signed)
Crystal Villa has completed her 8 weeks of treatment with Mavyret for chronic Hepatitis C. Check Hepatitis C RNA level today. Discussed plan of care if Hepatitis C RNA is undetectable will recheck in 3 months for SVR12. No additional screening for hepatocellular carcinoma is needed.

## 2022-01-04 LAB — HEPATITIS C RNA QUANTITATIVE
HCV Quantitative Log: <1.18 log IU/mL
HCV RNA, PCR, QN: <15 IU/mL

## 2022-02-02 NOTE — Telephone Encounter (Signed)
Ok to schedule per Dr. Silverio Decamp called patient left a voicemail.

## 2022-02-03 ENCOUNTER — Encounter: Payer: Self-pay | Admitting: Gastroenterology

## 2022-02-03 NOTE — Telephone Encounter (Signed)
Patient has been scheduled for colon on 12/22 at 11:00 and PV on 12/1 at 10:00

## 2022-03-23 ENCOUNTER — Ambulatory Visit
Admission: EM | Admit: 2022-03-23 | Discharge: 2022-03-23 | Disposition: A | Discharge status: Home / Self Care | Payer: BC Managed Care – PPO | Attending: Nurse Practitioner | Admitting: Nurse Practitioner

## 2022-03-23 ENCOUNTER — Ambulatory Visit (INDEPENDENT_AMBULATORY_CARE_PROVIDER_SITE_OTHER): Payer: BC Managed Care – PPO

## 2022-03-23 DIAGNOSIS — Z760 Encounter for issue of repeat prescription: Secondary | ICD-10-CM

## 2022-03-23 DIAGNOSIS — R1031 Right lower quadrant pain: Secondary | ICD-10-CM

## 2022-03-23 DIAGNOSIS — K5900 Constipation, unspecified: Secondary | ICD-10-CM

## 2022-03-23 DIAGNOSIS — J431 Panlobular emphysema: Secondary | ICD-10-CM

## 2022-03-23 DIAGNOSIS — R109 Unspecified abdominal pain: Secondary | ICD-10-CM | POA: Diagnosis not present

## 2022-03-23 LAB — POCT URINALYSIS DIP (MANUAL ENTRY)
Bilirubin, UA: NEGATIVE
Blood, UA: NEGATIVE
Glucose, UA: NEGATIVE mg/dL
Ketones, POC UA: NEGATIVE mg/dL
Leukocytes, UA: NEGATIVE
Nitrite, UA: NEGATIVE
Protein Ur, POC: NEGATIVE mg/dL
Spec Grav, UA: >=1.03 — AB (ref 1.010–1.025)
Urobilinogen, UA: 0.2 E.U./dL
pH, UA: 5.5 (ref 5.0–8.0)

## 2022-03-23 MED ORDER — ANORO ELLIPTA 62.5-25 MCG/ACT IN AEPB: INHALATION_SPRAY | RESPIRATORY_TRACT | 0 refills | Status: DC

## 2022-03-23 MED ORDER — SENNOSIDES-DOCUSATE SODIUM 8.6-50 MG PO TABS: 1.0000 | ORAL_TABLET | Freq: Every day | ORAL | 0 refills | Status: AC

## 2022-03-23 MED ORDER — POLYETHYLENE GLYCOL 3350 17 GM/SCOOP PO POWD
1.0000 | Freq: Once | ORAL | 0 refills | Status: AC
Start: 2022-03-23 — End: 2022-03-23

## 2022-03-23 NOTE — Discharge Instructions (Addendum)
Your urinalysis shows that you need to increase your water intake.  The x-ray also shows that you are constipated. There was no findings to suggest you have a kidney stone.    Take medication as prescribed. Try to drink at least 8-10 eight ounce glasses of water daily while symptoms persist. Recommend a diet that is high in fiber, this includes increasing stools. Also try to increase her activity.  This will help increase your intestinal motility. May take over-the-counter Tylenol as needed for pain or discomfort. May apply warm compresses to the abdomen as needed for pain or discomfort. As discussed, please follow-up with your primary care physician for further evaluation if your symptoms continue to persist. Please go to the emergency department immediately if you develop severe abdominal pain with fever, chills, worsening nausea, vomiting, diarrhea, or other concerns. Follow-up as needed.

## 2022-03-23 NOTE — ED Provider Notes (Signed)
[facility]    CSN: [id number] Arrival date & time: 03/23/22  1045      History   Chief Complaint Chief Complaint  Patient presents with   Abdominal Pain    HPI Crystal Villa is a 61 y.o. female.   The history is provided by the patient.   The patient presents for complaints of right-sided right lower quadrant/pelvic pain accompanied by nausea that has been present for the past 2 to 3 months.  Patient states over the past several days, the pain has become significantly worse.  She states that the pain is dull, but can also become sharp.  She states that the pain makes her "stop".  She always has urinary frequency at baseline, but states this has not changed.  She denies hematuria, dysuria, low back pain.  This bowel movement was today.  Patient states that she does have a history of endometriosis.  She states she has had a complete hysterectomy.  She also states that she does have a history of kidney stones.  Past Medical History:  Diagnosis Date   Arthritis    COPD (chronic obstructive pulmonary disease) (HCC)    GERD (gastroesophageal reflux disease)    Hepatitis C     Patient Active Problem List   Diagnosis Date Noted   Chronic hepatitis C without hepatic coma (HCC) 09/10/2021    Past Surgical History:  Procedure Laterality Date   ABDOMINAL HYSTERECTOMY     BACK SURGERY     SPINE SURGERY      OB History   No obstetric history on file.      Home Medications    Prior to Admission medications   Medication Sig Start Date End Date Taking? Authorizing Provider  polyethylene glycol powder (GLYCOLAX/MIRALAX) 17 GM/SCOOP powder Take 255 g by mouth once for 1 dose. 03/23/22 03/23/22 Yes Fuad Forget-Warren, Sadie Haber, NP  senna-docusate (SENOKOT-S) 8.6-50 MG tablet Take 1 tablet by mouth daily. 03/23/22  Yes Laparis Durrett-Warren, Sadie Haber, NP  albuterol (VENTOLIN HFA) 108 (90 Base) MCG/ACT inhaler Inhale into the lungs. 03/21/19   [provider]  benzonatate  (TESSALON) 100 MG capsule Take 1 capsule by mouth every 8 (eight) hours for cough. 12/17/21   Particia Nearing, PA-C  fluticasone [facility]) 50 MCG/ACT nasal spray Place 1 spray into both nostrils 2 (two) times daily. 12/17/21   Particia Nearing, PA-C  levalbuterol [facility] HFA) 45 MCG/ACT inhaler Inhale 1-2 puffs into the lungs every 4 (four) hours as needed for wheezing. 07/15/21   Janeece Agee, NP  pantoprazole (PROTONIX) 40 MG tablet TAKE 1 TABLET BY MOUTH EVERY DAY 10/12/21   Janeece Agee, NP  promethazine-dextromethorphan (PROMETHAZINE-DM) 6.25-15 MG/5ML syrup Take 5 mLs by mouth 4 (four) times daily as needed. 09/20/21   Particia Nearing, PA-C  sucralfate (CARAFATE) 1 g tablet Take 1 tablet (1 g total) by mouth 3 (three) times daily as needed. 06/08/21   Particia Nearing, PA-C  umeclidinium-vilanterol [facility] ELLIPTA) 62.5-25 MCG/ACT AEPB Inhale 1 puff into the lungs once daily. 03/23/22   Lakoda Raske-Warren, Sadie Haber, NP  cetirizine (ZYRTEC ALLERGY) 10 MG tablet Take 1 tablet (10 mg total) by mouth daily. 10/02/19 01/14/20  AvegnoZachery Dakins, FNP    Family History Family History  Problem Relation Age of Onset   Healthy Mother    Hyperlipidemia Father     Social History Social History   Tobacco Use   Smoking status: Former    Packs/day: 1.00    Years: 15.00  Total pack years: 15.00    Types: Cigarettes, Cigars    Quit date: 04/21/2017    Years since quitting: 4.9   Smokeless tobacco: Never  Vaping Use   Vaping Use: Never used  Substance Use Topics   Alcohol use: Never   Drug use: Never     Allergies   Patient has no known allergies.   Review of Systems Review of Systems Per HPI  Physical Exam Triage Vital Signs ED Triage Vitals  Enc Vitals Group     BP 03/23/22 1234 135/83     Pulse Rate 03/23/22 1234 80     Resp 03/23/22 1234 16     Temp 03/23/22 1234 98.3 F (36.8 C)     Temp Source 03/23/22 1234 Oral     SpO2 03/23/22 1234 96 %      Weight --      Height --      Head Circumference --      Peak Flow --      Pain Score 03/23/22 1233 8     Pain Loc --      Pain Edu? --      Excl. in GC? --    No data found.  Updated Vital Signs BP 135/83 (BP Location: Right Arm)   Pulse 80   Temp 98.3 F (36.8 C) (Oral)   Resp 16   SpO2 96%   Visual Acuity Right Eye Distance:   Left Eye Distance:   Bilateral Distance:    Right Eye Near:   Left Eye Near:    Bilateral Near:     Physical Exam Vitals and nursing note reviewed.  Constitutional:      General: She is not in acute distress.    Appearance: She is well-developed.  HENT:     Head: Normocephalic.  Cardiovascular:     Rate and Rhythm: Normal rate and regular rhythm.     Heart sounds: Normal heart sounds.  Pulmonary:     Effort: Pulmonary effort is normal. No respiratory distress.     Breath sounds: Normal breath sounds. No stridor. No wheezing, rhonchi or rales.  Abdominal:     General: Bowel sounds are normal. There is no distension.     Palpations: Abdomen is soft.     Tenderness: There is abdominal tenderness in the right lower quadrant. There is right CVA tenderness.  Skin:    General: Skin is warm and dry.  Neurological:     General: No focal deficit present.     Mental Status: She is alert and oriented to person, place, and time.  Psychiatric:        Mood and Affect: Mood normal.        Behavior: Behavior normal.      UC Treatments / Results  Labs (all labs ordered are listed, but only abnormal results are displayed) Labs Reviewed  POCT URINALYSIS DIP (MANUAL ENTRY) - Abnormal; Notable for the following components:      Result Value   Spec Grav, UA >=1.030 (*)    All other components within normal limits    EKG   Radiology DG Abd 2 Views  Result Date: 03/23/2022 CLINICAL DATA:  Abdominal pain 2-3 months. EXAM: ABDOMEN - 2 VIEW COMPARISON:  None Available. FINDINGS: Bowel gas pattern is nonobstructive with mild fecal retention  throughout the colon. No free peritoneal air. Bones and soft tissues are unremarkable. IMPRESSION: Nonobstructive bowel gas pattern with mild fecal retention throughout the colon. Electronically Signed  By: Elberta Fortis M.D.   On: 03/23/2022 13:01    Procedures Procedures (including critical care time)  Medications Ordered in UC Medications - No data to display  Initial Impression / Assessment and Plan / UC Course  I have reviewed the triage vital signs and the nursing notes.  Pertinent labs & imaging results that were available during my care of the patient were reviewed by me and considered in my medical decision making (see chart for details).  Urinalysis is negative for urinary tract infection, but with elevated specific gravity. X-ray does show constipation.  Cannot rule out nephrolithiasis; however, this is less likely.  No concern for acute abdomen as symptoms have been present for the past 2 to 3 months.  Right lower quadrant abdominal pain most likely associated with constipation.  Will treat patient with MiraLAX and senna 8.6 mg.  Patient was also provided a refill of her Anoro Ellipta for COPD.  Supportive care recommendations were provided to the patient to include increasing fluids, eating a diet that is high in fiber, and taking medication as prescribed.  Patient was given strict ER precautions.  Patient advised that if symptoms fail to improve, recommend that she follow-up with her primary care physician for further evaluation.  Patient verbalizes understanding.  All questions were answered.  Patient is stable for discharge.  Work note was provided. Final Clinical Impressions(s) / UC Diagnoses   Final diagnoses:  Right lower quadrant abdominal pain  Constipation, unspecified constipation type  Encounter for medication refill     Discharge Instructions      Your urinalysis shows that you need to increase your water intake.  The x-ray also shows that you are constipated.  There was no findings to suggest you have a kidney stone.    Take medication as prescribed. Try to drink at least 8-10 eight ounce glasses of water daily while symptoms persist. Recommend a diet that is high in fiber, this includes increasing stools. Also try to increase her activity.  This will help increase your intestinal motility. May take over-the-counter Tylenol as needed for pain or discomfort. May apply warm compresses to the abdomen as needed for pain or discomfort. As discussed, please follow-up with your primary care physician for further evaluation if your symptoms continue to persist. Please go to the emergency department immediately if you develop severe abdominal pain with fever, chills, worsening nausea, vomiting, diarrhea, or other concerns. Follow-up as needed.     ED Prescriptions     Medication Sig Dispense Auth. Provider   umeclidinium-vilanterol (ANORO ELLIPTA) 62.5-25 MCG/ACT AEPB Inhale 1 puff into the lungs once daily. 60 each Braylei Totino-Warren, Sadie Haber, NP   polyethylene glycol powder (GLYCOLAX/MIRALAX) 17 GM/SCOOP powder Take 255 g by mouth once for 1 dose. 510 g Niomie Englert-Warren, Sadie Haber, NP   senna-docusate (SENOKOT-S) 8.6-50 MG tablet Take 1 tablet by mouth daily. 30 tablet Ezmeralda Stefanick-Warren, Sadie Haber, NP      PDMP not reviewed this encounter.   Abran Cantor, NP 03/23/22 1454

## 2022-03-23 NOTE — ED Triage Notes (Signed)
Pt reports right lower quadrant abdominal pain and nausea x 2-3 months, worse in th past 3 days.   Pt needs Anoro refill.

## 2022-03-26 ENCOUNTER — Ambulatory Visit (AMBULATORY_SURGERY_CENTER): Payer: BC Managed Care – PPO

## 2022-03-26 ENCOUNTER — Ambulatory Visit: Payer: BC Managed Care – PPO | Admitting: Family

## 2022-03-26 ENCOUNTER — Encounter: Payer: Self-pay | Admitting: Family

## 2022-03-26 ENCOUNTER — Encounter: Payer: Self-pay | Admitting: Gastroenterology

## 2022-03-26 ENCOUNTER — Other Ambulatory Visit: Payer: Self-pay

## 2022-03-26 VITALS — BP 127/86 | HR 81 | Temp 97.6°F | Resp 16 | Ht 64.0 in | Wt 167.4 lb

## 2022-03-26 VITALS — Ht 64.0 in | Wt 167.4 lb

## 2022-03-26 DIAGNOSIS — Z86010 Personal history of colonic polyps: Secondary | ICD-10-CM

## 2022-03-26 DIAGNOSIS — B182 Chronic viral hepatitis C: Secondary | ICD-10-CM

## 2022-03-26 MED ORDER — NA SULFATE-K SULFATE-MG SULF 17.5-3.13-1.6 GM/177ML PO SOLN: 1.0000 | Freq: Once | ORAL | 0 refills | Status: AC

## 2022-03-26 NOTE — Patient Instructions (Signed)
Nice to see you.  We will check your lab work today.  Continue to take your medication daily as prescribed.  Refills have been sent to the pharmacy.  Have a great day and stay safe!  

## 2022-03-26 NOTE — Progress Notes (Signed)
Denies allergies to eggs or soy products. Denies complication of anesthesia or sedation. Denies use of weight loss medication. Denies use of O2.   Emmi instructions given for colonoscopy.  

## 2022-03-26 NOTE — Progress Notes (Signed)
Subjective:    Patient ID: Crystal Villa, female    DOB: 08/14/1960, 61 y.o.   MRN: [id number]  Chief Complaint  Patient presents with   Follow-up    Chronic hepatitis C without hepatic coma    HPI:  Crystal Villa is a 61 y.o. female with chronic Hepatitis C last seen on 01/01/22 at the completion of treatment with good adherence and tolerance to Mavyret. Hepatitis C RNA level was undetectable. Here today for SVR12 visit.   Ms. Crystal Villa has been doing well since her last office visit and currently working through constipation and has an appointment with GI this afternoon. Otherwise feeling well with no concerns.    No Known Allergies    Outpatient Medications Prior to Visit  Medication Sig Dispense Refill   fluticasone (FLONASE) 50 MCG/ACT nasal spray Place 1 spray into both nostrils 2 (two) times daily. 16 g 2   levalbuterol (XOPENEX HFA) 45 MCG/ACT inhaler Inhale 1-2 puffs into the lungs every 4 (four) hours as needed for wheezing. 15 g 0   pantoprazole (PROTONIX) 40 MG tablet TAKE 1 TABLET BY MOUTH EVERY DAY 90 tablet 1   senna-docusate (SENOKOT-S) 8.6-50 MG tablet Take 1 tablet by mouth daily. (Patient not taking: Reported on 03/26/2022) 30 tablet 0   sucralfate (CARAFATE) 1 g tablet Take 1 tablet (1 g total) by mouth 3 (three) times daily as needed. (Patient not taking: Reported on 03/26/2022) 90 tablet 1   umeclidinium-vilanterol (ANORO ELLIPTA) 62.5-25 MCG/ACT AEPB Inhale 1 puff into the lungs once daily. 60 each 0   albuterol (VENTOLIN HFA) 108 (90 Base) MCG/ACT inhaler Inhale into the lungs.     benzonatate (TESSALON) 100 MG capsule Take 1 capsule by mouth every 8 (eight) hours for cough. 21 capsule 0   promethazine-dextromethorphan (PROMETHAZINE-DM) 6.25-15 MG/5ML syrup Take 5 mLs by mouth 4 (four) times daily as needed. 100 mL 0   No facility-administered medications prior to visit.     Past Medical History:  Diagnosis Date   Allergy    Arthritis    COPD (chronic  obstructive pulmonary disease) (HCC)    GERD (gastroesophageal reflux disease)    Hepatitis C      Past Surgical History:  Procedure Laterality Date   ABDOMINAL HYSTERECTOMY     BACK SURGERY     SPINE SURGERY         Review of Systems  Constitutional:  Negative for chills, fatigue, fever and unexpected weight change.  Respiratory:  Negative for cough, chest tightness, shortness of breath and wheezing.   Cardiovascular:  Negative for chest pain and leg swelling.  Gastrointestinal:  Positive for constipation. Negative for abdominal distention, diarrhea, nausea and vomiting.  Neurological:  Negative for dizziness, weakness, light-headedness and headaches.  Hematological:  Does not bruise/bleed easily.      Objective:    BP 127/86   Pulse 81   Temp 97.6 F (36.4 C) (Oral)   Resp 16   Ht 5\' 4"  (1.626 m)   Wt 167 lb 6.4 oz (75.9 kg)   BMI 28.73 kg/m  Nursing note and vital signs reviewed.  Physical Exam Constitutional:      General: She is not in acute distress.    Appearance: She is well-developed.  Cardiovascular:     Rate and Rhythm: Normal rate and regular rhythm.     Heart sounds: Normal heart sounds. No murmur heard.    No friction rub. No gallop.  Pulmonary:     Effort: Pulmonary effort  is normal. No respiratory distress.     Breath sounds: Normal breath sounds. No wheezing or rales.  Chest:     Chest wall: No tenderness.  Skin:    General: Skin is warm and dry.  Neurological:     Mental Status: She is alert and oriented to person, place, and time.  Psychiatric:        Mood and Affect: Mood normal.         03/26/2022   10:32 AM 01/01/2022   10:14 AM 09/10/2021    8:45 AM 08/19/2021    3:02 PM 07/15/2021    4:00 PM  Depression screen PHQ 2/9  Decreased Interest 0 0 0 0 0  Down, Depressed, Hopeless 0 0 0 0 0  PHQ - 2 Score 0 0 0 0 0  Altered sleeping    0 0  Tired, decreased energy    0 0  Change in appetite    0 0  Feeling bad or failure about  yourself     0 0  Trouble concentrating    0 0  Moving slowly or fidgety/restless    0 0  Suicidal thoughts    0 0  PHQ-9 Score    0 0  Difficult doing work/chores    Not difficult at all Not difficult at all       Assessment & Plan:    Patient Active Problem List   Diagnosis Date Noted   Chronic hepatitis C without hepatic coma ([place]) 09/10/2021     Problem List Items Addressed This Visit       Digestive   Chronic hepatitis C without hepatic coma ([place]) - Primary    Crystal Villa has completed treatment for Hepatitis C. Recheck Hepatitis C RNA level today, if negative, no additional treatment is needed. If screening is needed in the future, Hepatitis C RNA level will need to be checked as Hepatitis C antibody will always be positive. No additional [place] screenings are needed. Follow up with ID as needed pending lab work.       Relevant Orders   Hepatitis C RNA quantitative (Completed)     I am having Crystal Villa maintain her sucralfate, levalbuterol, pantoprazole, fluticasone, Anoro Ellipta, and senna-docusate.   Follow-up: As needed pending lab work results.    Terri Piedra, MSN, FNP-C Nurse Practitioner [facility] [place] number: [phone]

## 2022-03-29 LAB — HEPATITIS C RNA QUANTITATIVE
HCV Quantitative Log: <1.18 log IU/mL
HCV RNA, PCR, QN: <15 IU/mL

## 2022-03-29 NOTE — Assessment & Plan Note (Signed)
Waver has completed treatment for Hepatitis C. Recheck Hepatitis C RNA level today, if negative, no additional treatment is needed. If screening is needed in the future, Hepatitis C RNA level will need to be checked as Hepatitis C antibody will always be positive. No additional HCC screenings are needed. Follow up with ID as needed pending lab work.

## 2022-04-09 ENCOUNTER — Encounter: Payer: Self-pay | Admitting: Gastroenterology

## 2022-04-09 ENCOUNTER — Ambulatory Visit (AMBULATORY_SURGERY_CENTER): Payer: BC Managed Care – PPO | Admitting: Gastroenterology

## 2022-04-09 VITALS — BP 121/73 | HR 73 | Temp 97.1°F | Resp 25 | Ht 64.0 in | Wt 167.4 lb

## 2022-04-09 DIAGNOSIS — Z09 Encounter for follow-up examination after completed treatment for conditions other than malignant neoplasm: Secondary | ICD-10-CM

## 2022-04-09 DIAGNOSIS — Z1211 Encounter for screening for malignant neoplasm of colon: Secondary | ICD-10-CM | POA: Diagnosis not present

## 2022-04-09 DIAGNOSIS — Z86010 Personal history of colonic polyps: Secondary | ICD-10-CM | POA: Diagnosis not present

## 2022-04-09 MED ORDER — SODIUM CHLORIDE 0.9 % IV SOLN: 500.0000 mL | INTRAVENOUS | Status: DC

## 2022-04-09 NOTE — Op Note (Signed)
[facility] Patient Name: Crystal Villa Procedure Date: 04/09/2022 11:23 AM MRN: [id number] Endoscopist: Napoleon Form , MD, [id number] Age: 61 Referring MD:  Date of Birth: 1961/01/08 Gender: Female Account #: [id number] Procedure:                Colonoscopy Indications:              High risk colon cancer surveillance: Personal                            history of colonic polyps, Surveillance: Personal                            history of colonic polyps (unknown histology) on                            last colonoscopy more than 5 years ago Medicines:                Monitored Anesthesia Care Procedure:                Pre-Anesthesia Assessment:                           - Prior to the procedure, a History and Physical                            was performed, and patient medications and                            allergies were reviewed. The patient's tolerance of                            previous anesthesia was also reviewed. The risks                            and benefits of the procedure and the sedation                            options and risks were discussed with the patient.                            All questions were answered, and informed consent                            was obtained. Prior Anticoagulants: The patient has                            taken no anticoagulant or antiplatelet agents. ASA                            Grade Assessment: II - A patient with mild systemic                            disease. After reviewing the risks and benefits,  the patient was deemed in satisfactory condition to                            undergo the procedure.                           After obtaining informed consent, the colonoscope                            was passed under direct vision. Throughout the                            procedure, the patient's blood pressure, pulse, and                            oxygen  saturations were monitored continuously. The                            Olympus PCF-H190DL (#[id number]) Colonoscope was                            introduced through the anus and advanced to the the                            cecum, identified by the appendiceal orifice,                            ileocecal valve and palpation. The colonoscopy was                            performed without difficulty. The patient tolerated                            the procedure well. The quality of the bowel                            preparation was good. The ileocecal valve,                            appendiceal orifice, and rectum were photographed. Scope In: 11:27:00 AM Scope Out: 11:39:43 AM Scope Withdrawal Time: 0 hours 10 minutes 43 seconds  Total Procedure Duration: 0 hours 12 minutes 43 seconds  Findings:                 The perianal and digital rectal examinations were                            normal.                           Scattered small-mouthed diverticula were found in                            the sigmoid colon, descending colon, transverse  colon and ascending colon.                           Non-bleeding external and internal hemorrhoids were                            found during retroflexion. The hemorrhoids were                            small.                           The exam was otherwise without abnormality. Complications:            No immediate complications. Estimated Blood Loss:     Estimated blood loss was minimal. Impression:               - Diverticulosis in the sigmoid colon, in the                            descending colon, in the transverse colon and in                            the ascending colon.                           - Non-bleeding external and internal hemorrhoids.                           - The examination was otherwise normal.                           - No specimens collected. Recommendation:           - Patient has  a contact number available for                            emergencies. The signs and symptoms of potential                            delayed complications were discussed with the                            patient. Return to normal activities tomorrow.                            Written discharge instructions were provided to the                            patient.                           - Resume previous diet.                           - Continue present medications.                           -  Repeat colonoscopy in 10 years for surveillance. Napoleon FormKavitha V. Escarlet Saathoff, MD 04/09/2022 11:44:00 AM This report has been signed electronically.

## 2022-04-09 NOTE — Patient Instructions (Signed)
Handout on hemorrhoids and diverticulosis given to patient. Repeat colonoscopy in 10 years for surveillance!  YOU HAD AN ENDOSCOPIC PROCEDURE TODAY AT THE [facility]:   Refer to the procedure report that was given to you for any specific questions about what was found during the examination.  If the procedure report does not answer your questions, please call your gastroenterologist to clarify.  If you requested that your care partner not be given the details of your procedure findings, then the procedure report has been included in a sealed envelope for you to review at your convenience later.  YOU SHOULD EXPECT: Some feelings of bloating in the abdomen. Passage of more gas than usual.  Walking can help get rid of the air that was put into your GI tract during the procedure and reduce the bloating. If you had a lower endoscopy (such as a colonoscopy or flexible sigmoidoscopy) you may notice spotting of blood in your stool or on the toilet paper. If you underwent a bowel prep for your procedure, you may not have a normal bowel movement for a few days.  Please Note:  You might notice some irritation and congestion in your nose or some drainage.  This is from the oxygen used during your procedure.  There is no need for concern and it should clear up in a day or so.  SYMPTOMS TO REPORT IMMEDIATELY:  Following lower endoscopy (colonoscopy or flexible sigmoidoscopy):  Excessive amounts of blood in the stool  Significant tenderness or worsening of abdominal pains  Swelling of the abdomen that is new, acute  Fever of 100F or higher  For urgent or emergent issues, a gastroenterologist can be reached at any hour by calling [phone]. Do not use MyChart messaging for urgent concerns.    DIET:  We do recommend a small meal at first, but then you may proceed to your regular diet.  Drink plenty of fluids but you should avoid alcoholic beverages for 24 hours.  ACTIVITY:  You should plan  to take it easy for the rest of today and you should NOT DRIVE or use heavy machinery until tomorrow (because of the sedation medicines used during the test).    FOLLOW UP: Our staff will call the number listed on your records the next business day following your procedure.  We will call around 7:15- 8:00 am to check on you and address any questions or concerns that you may have regarding the information given to you following your procedure. If we do not reach you, we will leave a message.     If any biopsies were taken you will be contacted by phone or by letter within the next 1-3 weeks.  Please call us at [phone] if you have not heard about the biopsies in 3 weeks.    SIGNATURES/CONFIDENTIALITY: You and/or your care partner have signed paperwork which will be entered into your electronic medical record.  These signatures attest to the fact that that the information above on your After Visit Summary has been reviewed and is understood.  Full responsibility of the confidentiality of this discharge information lies with you and/or your care-partner.

## 2022-04-09 NOTE — Progress Notes (Signed)
[facility] History and Physical   Primary Care Physician:  Janeece Agee, NP   Reason for Procedure:  History of adenomatous colon polyps  Plan:    Surveillance colonoscopy with possible interventions as needed     HPI: Crystal Villa is a very pleasant 61 y.o. female here for surveillance colonoscopy. Denies any nausea, vomiting, abdominal pain, melena or bright red blood per rectum  The risks and benefits as well as alternatives of endoscopic procedure(s) have been discussed and reviewed. All questions answered. The patient agrees to proceed.    Past Medical History:  Diagnosis Date   Allergy    Arthritis    COPD (chronic obstructive pulmonary disease) (HCC)    GERD (gastroesophageal reflux disease)    Hepatitis C     Past Surgical History:  Procedure Laterality Date   ABDOMINAL HYSTERECTOMY     BACK SURGERY     SPINE SURGERY      Prior to Admission medications   Medication Sig Start Date End Date Taking? Authorizing Provider  OVER THE COUNTER MEDICATION Tylenol one capsule as needed.   Yes [provider]  pantoprazole (PROTONIX) 40 MG tablet TAKE 1 TABLET BY MOUTH EVERY DAY 10/12/21  Yes Janeece Agee, NP  senna-docusate (SENOKOT-S) 8.6-50 MG tablet Take 1 tablet by mouth daily. 03/23/22  Yes Leath-Warren, Sadie Haber, NP  fluticasone (FLONASE) 50 MCG/ACT nasal spray Place 1 spray into both nostrils 2 (two) times daily. 12/17/21   Particia Nearing, PA-C  levalbuterol [facility] HFA) 45 MCG/ACT inhaler Inhale 1-2 puffs into the lungs every 4 (four) hours as needed for wheezing. 07/15/21   Janeece Agee, NP  OVER THE COUNTER MEDICATION Goody's powder, one as needed.    [provider]  OVER THE COUNTER MEDICATION Excedrine, one tablet as needed.    [provider]  sucralfate (CARAFATE) 1 g tablet Take 1 tablet (1 g total) by mouth 3 (three) times daily as needed. Patient not taking: Reported on 03/26/2022 06/08/21   Particia Nearing, PA-C  umeclidinium-vilanterol [facility] ELLIPTA) 62.5-25 MCG/ACT AEPB Inhale 1 puff into the lungs once daily. 03/23/22   Leath-Warren, Sadie Haber, NP  cetirizine (ZYRTEC ALLERGY) 10 MG tablet Take 1 tablet (10 mg total) by mouth daily. 10/02/19 01/14/20  Durward Parcel, FNP    Current Outpatient Medications  Medication Sig Dispense Refill   OVER THE COUNTER MEDICATION Tylenol one capsule as needed.     pantoprazole (PROTONIX) 40 MG tablet TAKE 1 TABLET BY MOUTH EVERY DAY 90 tablet 1   senna-docusate (SENOKOT-S) 8.6-50 MG tablet Take 1 tablet by mouth daily. 30 tablet 0   fluticasone (FLONASE) 50 MCG/ACT nasal spray Place 1 spray into both nostrils 2 (two) times daily. 16 g 2   levalbuterol (XOPENEX HFA) 45 MCG/ACT inhaler Inhale 1-2 puffs into the lungs every 4 (four) hours as needed for wheezing. 15 g 0   OVER THE COUNTER MEDICATION Goody's powder, one as needed.     OVER THE COUNTER MEDICATION Excedrine, one tablet as needed.     sucralfate (CARAFATE) 1 g tablet Take 1 tablet (1 g total) by mouth 3 (three) times daily as needed. (Patient not taking: Reported on 03/26/2022) 90 tablet 1   umeclidinium-vilanterol (ANORO ELLIPTA) 62.5-25 MCG/ACT AEPB Inhale 1 puff into the lungs once daily. 60 each 0   Current Facility-Administered Medications  Medication Dose Route Frequency Provider Last Rate Last Admin   0.9 %  sodium chloride infusion  500 mL Intravenous Continuous Uchechukwu Dhawan,  Eleonore Chiquito, MD        Allergies as of 04/09/2022   (No Known Allergies)    Family History  Problem Relation Age of Onset   Healthy Mother    Hyperlipidemia Father    Colon cancer Maternal Grandmother    Esophageal cancer Neg Hx    Stomach cancer Neg Hx    Rectal cancer Neg Hx     Social History   Socioeconomic History   Marital status: Single    Spouse name: Not on file   Number of children: Not on file   Years of education: Not on file   Highest education level: Not on file   Occupational History   Not on file  Tobacco Use   Smoking status: Former    Packs/day: 1.00    Years: 15.00    Total pack years: 15.00    Types: Cigarettes, Cigars    Quit date: 04/21/2017    Years since quitting: 4.9   Smokeless tobacco: Former  Building services engineer Use: Never used  Substance and Sexual Activity   Alcohol use: Never   Drug use: Never   Sexual activity: Not Currently    Birth control/protection: None  Other Topics Concern   Not on file  Social History Narrative   Not on file   Social Determinants of Health   Financial Resource Strain: Not on file  Food Insecurity: Not on file  Transportation Needs: Not on file  Physical Activity: Not on file  Stress: Not on file  Social Connections: Not on file  Intimate Partner Violence: Not on file    Review of Systems:  All other review of systems negative except as mentioned in the HPI.  Physical Exam: Vital signs in last 24 hours: Blood Pressure 135/84   Pulse 89   Temperature (Abnormal) 97.1 F (36.2 C) (Temporal)   Height 5\' 4"  (1.626 m)   Weight 167 lb 6.4 oz (75.9 kg)   Oxygen Saturation 98%   Body Mass Index 28.73 kg/m  General:   Alert, NAD Lungs:  Clear .   Heart:  Regular rate and rhythm Abdomen:  Soft, nontender and nondistended. Neuro/Psych:  Alert and cooperative. Normal mood and affect. A and O x 3  Reviewed labs, radiology imaging, old records and pertinent past GI work up  Patient is appropriate for planned procedure(s) and anesthesia in an ambulatory setting   K. , MD [phone]

## 2022-04-09 NOTE — Progress Notes (Signed)
Report to PACU, RN, vss, BBS= Clear.  

## 2022-04-09 NOTE — Progress Notes (Signed)
Pt's states no medical or surgical changes since previsit or office visit. 

## 2022-04-13 ENCOUNTER — Encounter (HOSPITAL_COMMUNITY): Payer: Self-pay | Admitting: *Deleted

## 2022-04-13 ENCOUNTER — Emergency Department (HOSPITAL_COMMUNITY)
Admission: EM | Admit: 2022-04-13 | Discharge: 2022-04-13 | Disposition: A | Discharge status: Home / Self Care | Attending: Emergency Medicine | Admitting: Emergency Medicine

## 2022-04-13 ENCOUNTER — Emergency Department (HOSPITAL_COMMUNITY)

## 2022-04-13 ENCOUNTER — Other Ambulatory Visit: Payer: Self-pay

## 2022-04-13 ENCOUNTER — Ambulatory Visit
Admission: EM | Admit: 2022-04-13 | Discharge: 2022-04-13 | Disposition: A | Discharge status: Home / Self Care | Payer: Worker's Compensation

## 2022-04-13 DIAGNOSIS — S32512A Fracture of superior rim of left pubis, initial encounter for closed fracture: Secondary | ICD-10-CM | POA: Diagnosis not present

## 2022-04-13 DIAGNOSIS — Y990 Civilian activity done for income or pay: Secondary | ICD-10-CM | POA: Diagnosis not present

## 2022-04-13 DIAGNOSIS — S32592A Other specified fracture of left pubis, initial encounter for closed fracture: Secondary | ICD-10-CM | POA: Diagnosis not present

## 2022-04-13 DIAGNOSIS — S300XXA Contusion of lower back and pelvis, initial encounter: Secondary | ICD-10-CM | POA: Diagnosis not present

## 2022-04-13 DIAGNOSIS — S32599A Other specified fracture of unspecified pubis, initial encounter for closed fracture: Secondary | ICD-10-CM

## 2022-04-13 DIAGNOSIS — W19XXXA Unspecified fall, initial encounter: Secondary | ICD-10-CM | POA: Insufficient documentation

## 2022-04-13 DIAGNOSIS — S79912A Unspecified injury of left hip, initial encounter: Secondary | ICD-10-CM | POA: Diagnosis present

## 2022-04-13 MED ORDER — TETANUS-DIPHTH-ACELL PERTUSSIS 5-2.5-18.5 LF-MCG/0.5 IM SUSY: 0.5000 mL | PREFILLED_SYRINGE | Freq: Once | INTRAMUSCULAR | Status: DC

## 2022-04-13 MED ORDER — HYDROCODONE-ACETAMINOPHEN 5-325 MG PO TABS: 1.0000 | ORAL_TABLET | Freq: Four times a day (QID) | ORAL | 0 refills | Status: DC | PRN

## 2022-04-13 NOTE — ED Triage Notes (Signed)
Pt fell at work on Dec 15th left hip pain with bruising. Difficulty walking.

## 2022-04-13 NOTE — ED Provider Triage Note (Signed)
Emergency Medicine Provider Triage Evaluation Note  Crystal Villa , a 61 y.o. female  was evaluated in triage.  Pt complains of patient for all 11 days ago onto her bottom.  She is having fairly severe pain in her groin when she tries to ambulate on the left hip.  She has significant bruising across the bottom.  She also hit her elbow but is not having any issues with it.  She had an abrasion which is healing well and is aware of her last tetanus vaccination.  She has not had her tetanus updated in many years  Review of Systems  Positive: Hip pain Negative: Fever  Physical Exam  BP 129/85 (BP Location: Right Arm)   Pulse 88   Temp 99.1 F (37.3 C) (Oral)   Resp 18   Ht 5\' 4"  (1.626 m)   Wt 74.8 kg   SpO2 97%   BMI 28.32 kg/m  Gen:   Awake, no distress   Resp:  Normal effort  MSK:   Moves extremities without difficulty  Other:  Pain in the left hip  Medical Decision Making  Medically screening exam initiated at 3:42 PM.  Appropriate orders placed.  Shawnya Mayor was informed that the remainder of the evaluation will be completed by another provider, this initial triage assessment does not replace that evaluation, and the importance of remaining in the ED until their evaluation is complete.  She was able to ambulate with assistance but having a lot of trouble she is bathing ice and Tylenol without improvement   Lurline Idol, PA-C 04/13/22 1543

## 2022-04-13 NOTE — ED Provider Notes (Signed)
[facility]-EMERGENCY DEPT Provider Note   CSN: [id number] Arrival date & time: 04/13/22  1226     History  Chief Complaint  Patient presents with   Marletta Lor    Crystal Villa is a 61 y.o. female who complains of fall 11 days ago onto her bottom.  She is having fairly severe pain in her groin when she tries to ambulate on the left hip.  She has significant bruising across the bottom.  She also hit her elbow but is not having any issues with it.  She had an abrasion which is healing well and is aware of her last tetanus vaccination.  She has not had her tetanus updated in many years    Fall       Home Medications Prior to Admission medications   Medication Sig Start Date End Date Taking? Authorizing Provider  fluticasone (FLONASE) 50 MCG/ACT nasal spray Place 1 spray into both nostrils 2 (two) times daily. 12/17/21   Particia Nearing, PA-C  levalbuterol [facility] HFA) 45 MCG/ACT inhaler Inhale 1-2 puffs into the lungs every 4 (four) hours as needed for wheezing. 07/15/21   Janeece Agee, NP  OVER THE COUNTER MEDICATION Goody's powder, one as needed.    [provider]  OVER THE COUNTER MEDICATION Tylenol one capsule as needed.    [provider]  OVER THE COUNTER MEDICATION Excedrine, one tablet as needed.    [provider]  pantoprazole (PROTONIX) 40 MG tablet TAKE 1 TABLET BY MOUTH EVERY DAY 10/12/21   Janeece Agee, NP  senna-docusate (SENOKOT-S) 8.6-50 MG tablet Take 1 tablet by mouth daily. 03/23/22   Leath-Warren, Sadie Haber, NP  sucralfate (CARAFATE) 1 g tablet Take 1 tablet (1 g total) by mouth 3 (three) times daily as needed. Patient not taking: Reported on 03/26/2022 06/08/21   Particia Nearing, PA-C  umeclidinium-vilanterol [facility] ELLIPTA) 62.5-25 MCG/ACT AEPB Inhale 1 puff into the lungs once daily. 03/23/22   Leath-Warren, Sadie Haber, NP  cetirizine (ZYRTEC ALLERGY) 10 MG tablet Take 1 tablet (10 mg total) by mouth  daily. 10/02/19 01/14/20  Durward Parcel, FNP      Allergies    Patient has no known allergies.    Review of Systems   Review of Systems  Physical Exam Updated Vital Signs BP 133/65 (BP Location: Left Arm)   Pulse 83   Temp 98.3 F (36.8 C) (Oral)   Resp 16   Ht 5\' 4"  (1.626 m)   Wt 74.8 kg   SpO2 99%   BMI 28.32 kg/m  Physical Exam Vitals and nursing note reviewed.  Constitutional:      General: She is not in acute distress.    Appearance: She is well-developed. She is not diaphoretic.  HENT:     Head: Normocephalic and atraumatic.     Right Ear: External ear normal.     Left Ear: External ear normal.     Nose: Nose normal.     Mouth/Throat:     Mouth: Mucous membranes are moist.  Eyes:     General: No scleral icterus.    Conjunctiva/sclera: Conjunctivae normal.  Cardiovascular:     Rate and Rhythm: Normal rate and regular rhythm.     Heart sounds: Normal heart sounds. No murmur heard.    No friction rub. No gallop.  Pulmonary:     Effort: Pulmonary effort is normal. No respiratory distress.     Breath sounds: Normal breath sounds.  Abdominal:  General: Bowel sounds are normal. There is no distension.     Palpations: Abdomen is soft. There is no mass.     Tenderness: There is no abdominal tenderness. There is no guarding.  Musculoskeletal:     Cervical back: Normal range of motion.     Comments: Pain with range of motion of the left hip in the inguinal region  Skin:    General: Skin is warm and dry.     Comments: Bruising over the left hip and buttock  Neurological:     Mental Status: She is alert and oriented to person, place, and time.  Psychiatric:        Behavior: Behavior normal.     ED Results / Procedures / Treatments   Labs (all labs ordered are listed, but only abnormal results are displayed) Labs Reviewed - No data to display  EKG None  Radiology CT PELVIS WO CONTRAST  Result Date: 04/13/2022 CLINICAL DATA:  Pelvic fracture EXAM:  CT PELVIS WITHOUT CONTRAST TECHNIQUE: Multidetector CT imaging of the pelvis was performed following the standard protocol without intravenous contrast. RADIATION DOSE REDUCTION: This exam was performed according to the departmental dose-optimization program which includes automated exposure control, adjustment of the mA and/or kV according to patient size and/or use of iterative reconstruction technique. COMPARISON:  Radiographs earlier today FINDINGS: Urinary Tract:  No abnormality visualized. Bowel:  Colonic diverticulosis without diverticulitis. Vascular/Lymphatic: Aortic atherosclerotic calcification. No suspicious lymphadenopathy. Reproductive:  Hysterectomy. Other:  No free intraperitoneal fluid or air. Musculoskeletal: Comminuted mildly displaced fracture of the left superior pubic ramus and nondisplaced fracture of the left inferior pubic ramus (series 2/image 90). No pelvic diastasis or SI joint widening. IMPRESSION: Acute fractures of the left superior and inferior pubic rami. Electronically Signed   By: Minerva Fester M.D.   On: 04/13/2022 20:40   DG Hip Unilat W or Wo Pelvis 2-3 Views Left  Result Date: 04/13/2022 CLINICAL DATA:  Pain after fall EXAM: DG HIP (WITH OR WITHOUT PELVIS) 2-3V LEFT COMPARISON:  KUB March 23, 2022 FINDINGS: There is a mildly displaced fracture through the left superior pubic ramus. It is difficult to fracture 1 pubic ramus given the ring formation of the pelvic bones. A separate fracture is not definitively identified but there is a possible fracture line extending to the pubic symphysis. The proximal left femur is intact. No other fractures identified. No dislocations. IMPRESSION: There is a mildly displaced fracture through the left superior pubic ramus. It is difficult to fracture 1 pubic ramus given the ring formation of the pelvic bones. A separate fracture is not definitively identified but there is a possible fracture line extending to the pubic symphysis. CT  imaging could better evaluate the pelvic bone fracture/fractures. Electronically Signed   By: Gerome Sam III M.D.   On: 04/13/2022 17:10    Procedures Procedures    Medications Ordered in ED Medications  Tdap (BOOSTRIX) injection 0.5 mL (has no administration in time range)    ED Course/ Medical Decision Making/ A&P                           Medical Decision Making Patient here with fall.  Tdap updated.  I visualized and interpreted pelvic and hip x-ray which shows mildly displaced fractures through the pubic ramus.  Follow-up CT shows comminuted and mildly displaced fractures of the left superior and inferior pubic ramus.  Patient has been ambulatory at home for the past little  so 11 days and drove herself to the ER from [facility].  Will discharge home as she has a walker she can use.  Will add pain control and outpatient follow-up.  She does not have any evidence of unstable pelvic fracture and has been tolerating the situation at home well.  She appears otherwise appropriate for discharge at this time  Amount and/or Complexity of Data Reviewed Radiology: ordered.  Risk Prescription drug management.          Final Clinical Impression(s) / ED Diagnoses Final diagnoses:  None    Rx / DC Orders ED Discharge Orders     None         Arthor Captain, PA-C 04/13/22 2321    Melene Plan, DO 04/14/22 1558

## 2022-04-13 NOTE — Discharge Instructions (Signed)
Please use your walker at all times when standing and walking. Take maintenance medication as needed for your pelvic pain.  Follow closely with Dr. Christell Constant for further management and evaluation

## 2022-04-14 ENCOUNTER — Telehealth: Payer: Self-pay | Admitting: *Deleted

## 2022-04-14 NOTE — Telephone Encounter (Signed)
  Follow up Call-     04/09/2022   10:46 AM  Call back number  Post procedure Call Back phone  # [phone]  Permission to leave phone message Yes     Patient questions:  Do you have a fever, pain , or abdominal swelling? No. Pain Score  0 *  Have you tolerated food without any problems? Yes.    Have you been able to return to your normal activities? Yes.    Do you have any questions about your discharge instructions: Diet   No. Medications  No. Follow up visit  No.  Do you have questions or concerns about your Care? No.  Actions: * If pain score is 4 or above: No action needed, pain <4.   

## 2022-04-14 NOTE — Telephone Encounter (Signed)
  Follow up Call-     04/09/2022   10:46 AM  Call back number  Post procedure Call Back phone  # [phone]  Permission to leave phone message Yes     Patient questions:  Do you have a fever, pain , or abdominal swelling? No. Pain Score  0 *  Have you tolerated food without any problems? Yes.    Have you been able to return to your normal activities? Yes.    Do you have any questions about your discharge instructions: Diet   No. Medications  No. Follow up visit  No.  Do you have questions or concerns about your Care? No.  Actions: * If pain score is 4 or above: No action needed, pain <4.

## 2022-04-16 ENCOUNTER — Encounter: Payer: Self-pay | Admitting: Physician Assistant

## 2022-04-16 ENCOUNTER — Ambulatory Visit (INDEPENDENT_AMBULATORY_CARE_PROVIDER_SITE_OTHER): Admitting: Physician Assistant

## 2022-04-16 DIAGNOSIS — S329XXA Fracture of unspecified parts of lumbosacral spine and pelvis, initial encounter for closed fracture: Secondary | ICD-10-CM | POA: Insufficient documentation

## 2022-04-16 DIAGNOSIS — S32592A Other specified fracture of left pubis, initial encounter for closed fracture: Secondary | ICD-10-CM

## 2022-04-16 MED ORDER — HYDROCODONE-ACETAMINOPHEN 5-325 MG PO TABS: 1.0000 | ORAL_TABLET | Freq: Four times a day (QID) | ORAL | 0 refills | Status: DC | PRN

## 2022-04-16 NOTE — Progress Notes (Signed)
Office Visit Note   Patient: Crystal Villa           Date of Birth: 26-Dec-1960           MRN: [id number] Visit Date: 04/16/2022              Requested by: Maximiano Coss, NP [place],  [place] [address] PCP: Maximiano Coss, NP  No chief complaint on file.     HPI: Crystal Villa is a pleasant 61 year old woman who presents today 2 weeks status post a fall she sustained at work.  11 days later she was seen in the emergency department.  X-rays and a CT of her pelvis were done and demonstrated a left superior inferior pubic rami fracture.  She was given a walker.  She was told to follow-up with orthopedics.  She has overall been doing well she has been taking hydrocodone and for pain she denies any fever chills shortness of breath she does describe some altered sensation in her perineal region.  She denies any loss of bowel or bladder control  Assessment & Plan: Visit Diagnoses: 2 weeks status post superior and inferior pubic rami fracture of the left.  She is a work comp case she did speak with our Workmen's Comp. representative today.  She will be referred to see Dr. Laurance Flatten next week.  She is currently stable.  Did discuss ways she could get more comfortable and I have renewed her hydrocodone.  Also asked that she take aspirin 1 daily with food.  Will contact us if she has any deterioration of her symptoms.    Follow-Up Instructions: 1 week with Dr. Patric Dykes Exam  Patient is alert, oriented, no adenopathy, well-dressed, normal affect, normal respiratory effort. Patient appears well she is ambulating with a walker.  Compartments are soft nontender she is neurovascularly intact.  No radicular findings  Imaging: No results found. No images are attached to the encounter.  Labs: Lab Results  Component Value Date   HGBA1C 6.0 08/19/2021     Lab Results  Component Value Date   ALBUMIN 4.2 08/19/2021    No results found for: "MG" No results found for: "VD25OH"  No  results found for: "PREALBUMIN"    Latest Ref Rng & Units 08/19/2021    3:27 PM 08/08/2017   12:39 PM  CBC EXTENDED  WBC 4.0 - 10.5 K/uL 7.2  8.5   RBC 3.87 - 5.11 Mil/uL 4.25  4.59   Hemoglobin 12.0 - 15.0 g/dL 13.0  14.6   HCT 36.0 - 46.0 % 38.4  42.2   Platelets 150.0 - 400.0 K/uL 207.0  242   NEUT# 1.4 - 7.7 K/uL 4.4    Lymph# 0.7 - 4.0 K/uL 2.2       There is no height or weight on file to calculate BMI.  Orders:  No orders of the defined types were placed in this encounter.  Meds ordered this encounter  Medications   HYDROcodone-acetaminophen (NORCO) 5-325 MG tablet    Sig: Take 1-2 tablets by mouth every 6 (six) hours as needed for moderate pain.    Dispense:  20 tablet    Refill:  0     Procedures: No procedures performed  Clinical Data: No additional findings.  ROS:  All other systems negative, except as noted in the HPI. Review of Systems  Objective: Vital Signs: There were no vitals taken for this visit.  Specialty Comments:  No specialty comments available.  PMFS History: Patient  Active Problem List   Diagnosis Date Noted   Chronic hepatitis C without hepatic coma (HCC) 09/10/2021   Past Medical History:  Diagnosis Date   Allergy    Arthritis    COPD (chronic obstructive pulmonary disease) (HCC)    GERD (gastroesophageal reflux disease)    Hepatitis C     Family History  Problem Relation Age of Onset   Healthy Mother    Hyperlipidemia Father    Colon cancer Maternal Grandmother    Esophageal cancer Neg Hx    Stomach cancer Neg Hx    Rectal cancer Neg Hx     Past Surgical History:  Procedure Laterality Date   ABDOMINAL HYSTERECTOMY     BACK SURGERY     SPINE SURGERY     Social History   Occupational History   Not on file  Tobacco Use   Smoking status: Former    Packs/day: 1.00    Years: 15.00    Total pack years: 15.00    Types: Cigarettes, Cigars    Quit date: 04/21/2017    Years since quitting: 4.9   Smokeless tobacco:  Former  Building services engineer Use: Never used  Substance and Sexual Activity   Alcohol use: Never   Drug use: Never   Sexual activity: Not Currently    Birth control/protection: None

## 2022-04-23 ENCOUNTER — Ambulatory Visit (INDEPENDENT_AMBULATORY_CARE_PROVIDER_SITE_OTHER): Admitting: Orthopedic Surgery

## 2022-04-23 ENCOUNTER — Ambulatory Visit (INDEPENDENT_AMBULATORY_CARE_PROVIDER_SITE_OTHER)

## 2022-04-23 DIAGNOSIS — S32592A Other specified fracture of left pubis, initial encounter for closed fracture: Secondary | ICD-10-CM | POA: Diagnosis not present

## 2022-04-23 NOTE — Progress Notes (Signed)
Orthopedic Surgery Progress Note   Assessment: Patient is a 62 y.o. female with pubic rami fractures (~3 weeks from injury)   Plan: -No acute operative intervention -Weight bearing status: as tolerated bilaterally -OTC pain medications for pain relief -Talk to her about her bone density, she is going to talk to her primary care doctor about a DEXA scan -Provided with her with a work note stating she will be off for the next 3 weeks with expected return to light duty for the following 6 weeks after that -Can continue to use walker if that is helpful in terms of pain -Follow up in 3 weeks, XRs at next visit: AP/inlet/outlet pelvis    ___________________________________________________________________________  Subjective: Had a ground-level fall on 04/02/2022 at work.  Has had left-sided groin and buttock pain since that time.  Pain has been about the same since the injury.  She is ambulating with a walker.  Cannot walk very far without significant pain. Pain is worse with weight bearing and improves if she sits. No pain radiating down her legs.  Denies paresthesias or numbness.   Physical Exam:  General: no acute distress, appears stated age, walks halls with walker, able to put weight on bilateral lower extremities Neurologic: alert, answering questions appropriately, following commands Respiratory: unlabored breathing on room air, symmetric chest rise Psychiatric: appropriate affect, normal cadence to speech  MSK:   -Bilateral lower extremities  No tenderness to palpation over the extremity, no gross deformity EHL/TA/GSC intact Plantarflexes and dorsiflexes toes Sensation intact to light touch in sural, saphenous, tibial, deep peroneal, and superficial peroneal nerve distributions Foot warm and well perfused   XR of the pelvis from 04/23/2022 were reviewed today showing superior ramus fracture with no interval displacement from x-rays on 04/13/2022. No new fractures seen.    Patient name: Crystal Villa Patient MRN: [id number] Date: 04/23/22

## 2022-04-28 ENCOUNTER — Encounter: Payer: Self-pay | Admitting: Radiology

## 2022-05-14 ENCOUNTER — Telehealth: Payer: Self-pay | Admitting: Orthopedic Surgery

## 2022-05-14 ENCOUNTER — Ambulatory Visit (INDEPENDENT_AMBULATORY_CARE_PROVIDER_SITE_OTHER): Admitting: Orthopedic Surgery

## 2022-05-14 ENCOUNTER — Ambulatory Visit (INDEPENDENT_AMBULATORY_CARE_PROVIDER_SITE_OTHER)

## 2022-05-14 DIAGNOSIS — S32592A Other specified fracture of left pubis, initial encounter for closed fracture: Secondary | ICD-10-CM

## 2022-05-14 NOTE — Progress Notes (Signed)
Orthopedic Surgery Progress Note     Assessment: Patient is a 62 y.o. female with pubic rami fractures (~6 weeks from injury)     Plan: -No acute operative intervention -Weight bearing status: as tolerated bilaterally -Wean from ambulatory assistive devices -OTC pain medications for pain relief -As recommended last time, she will be light duty for the next 6 weeks. Hopefully will be able to return her to full duty after our next visit  -Follow up in 6 weeks, XRs at next visit: AP/inlet/outlet pelvis     ___________________________________________________________________________   Subjective: Had a ground-level fall on 04/02/2022 at work.  Has transitioned from walker to cane since I last saw her.  Pain is the same since the last time I saw her. She is having pain in the groin and around her ischial tuberosity. No pain elsewhere. No pain radiating down the leg. Pain is worse with activity. It is tolerable if she rests. Frustrated that it is has not gotten much better in six weeks. Denies paresthesias and numbness.      Physical Exam:   General: no acute distress, appears stated age Neurologic: alert, answering questions appropriately, following commands Respiratory: unlabored breathing on room air, symmetric chest rise Psychiatric: appropriate affect, normal cadence to speech   MSK:    -Bilateral lower extremities             No tenderness to palpation over the extremity, no gross deformity  No pain with log roll bilaterally EHL/TA/GSC intact Plantarflexes and dorsiflexes toes Sensation intact to light touch in sural, saphenous, tibial, deep peroneal, and superficial peroneal nerve distributions Foot warm and well perfused     XR of the pelvis from 05/14/2022 were reviewed today showing superior ramus fracture with no interval displacement from x-rays on 04/22/2022. No new fractures seen. No callus formation seen.    Patient name: Crystal Villa Patient MRN: [id number] Date:  05/14/22

## 2022-05-14 NOTE — Telephone Encounter (Signed)
Patient job needs her work restrictions faxed over to her job, and would like a copy herself sent to [employer]

## 2022-05-18 ENCOUNTER — Telehealth: Payer: Self-pay | Admitting: Orthopedic Surgery

## 2022-05-18 ENCOUNTER — Encounter: Payer: Self-pay | Admitting: Radiology

## 2022-05-18 NOTE — Telephone Encounter (Signed)
Pt called back with fax number for letter to be sent to her employer. Fax number is [phone]. Please put in letter what her restrictions are. Thank you

## 2022-05-18 NOTE — Telephone Encounter (Signed)
Letter written and sent to her MyChart. I sent her a MyChart message to send me the fax number to her employer so I could fax the note to.

## 2022-05-18 NOTE — Telephone Encounter (Signed)
Talked with patient and advised her that letter with restrictions was faxed to her employer at [phone].  Patient voiced that she understands.

## 2022-05-18 NOTE — Telephone Encounter (Signed)
Patient states her light duty note need to be specific as to restrictions. Please advise.

## 2022-06-25 ENCOUNTER — Ambulatory Visit (INDEPENDENT_AMBULATORY_CARE_PROVIDER_SITE_OTHER)

## 2022-06-25 ENCOUNTER — Ambulatory Visit (INDEPENDENT_AMBULATORY_CARE_PROVIDER_SITE_OTHER): Admitting: Orthopedic Surgery

## 2022-06-25 DIAGNOSIS — S32592A Other specified fracture of left pubis, initial encounter for closed fracture: Secondary | ICD-10-CM

## 2022-06-25 NOTE — Progress Notes (Signed)
Orthopedic Surgery Progress Note     Assessment: Patient is a 62 y.o. female with pubic rami fractures (~12 weeks from injury)     Plan: -No acute operative intervention -Weight bearing status: as tolerated bilaterally -OTC pain medications for pain relief -Work note provided today -Follow up in 12 weeks, XRs at next visit: AP/inlet/outlet pelvis     ___________________________________________________________________________   Subjective: Had a ground-level fall on 04/02/2022 at work.  Patient has improved significantly since the last time I saw her. She no longer needs any ambulatory assistive devices and her pain is minimal at this point. She occasionally has some soreness in her groin but it resolves with a little time. She is looking to gradually return to full duty at work. No radiating leg pain. Denies paresthesias and numbness.      Physical Exam:   General: no acute distress, appears stated age Neurologic: alert, answering questions appropriately, following commands Respiratory: unlabored breathing on room air, symmetric chest rise Psychiatric: appropriate affect, normal cadence to speech   MSK:    -Bilateral lower extremities             No tenderness to palpation over the extremity, no gross deformity             No pain with log roll bilaterally EHL/TA/GSC intact Plantarflexes and dorsiflexes toes Sensation intact to light touch in sural, saphenous, tibial, deep peroneal, and superficial peroneal nerve distributions Foot warm and well perfused     XR of the pelvis from 06/25/2022 were reviewed today showing rami fractures that have not changed in alignment. Fractures appear minimally displaced. No new fractures seen.    Patient name: Crystal Villa Patient MRN: [id number] Date: 06/25/22

## 2022-07-27 IMAGING — MG MM DIGITAL SCREENING BILAT W/ TOMO AND CAD
6 of 10 series · 6 of 30 positions shown · non-contrast
Comparison: Previous exam(s).

CLINICAL DATA: Screening.

EXAM:
DIGITAL SCREENING BILATERAL MAMMOGRAM WITH TOMOSYNTHESIS AND CAD
TECHNIQUE: Bilateral screening digital craniocaudal and mediolateral oblique
mammograms were obtained. Bilateral screening digital breast
tomosynthesis was performed. The images were evaluated with
computer-aided detection.

[R CC synth-2D (1 of 2)]
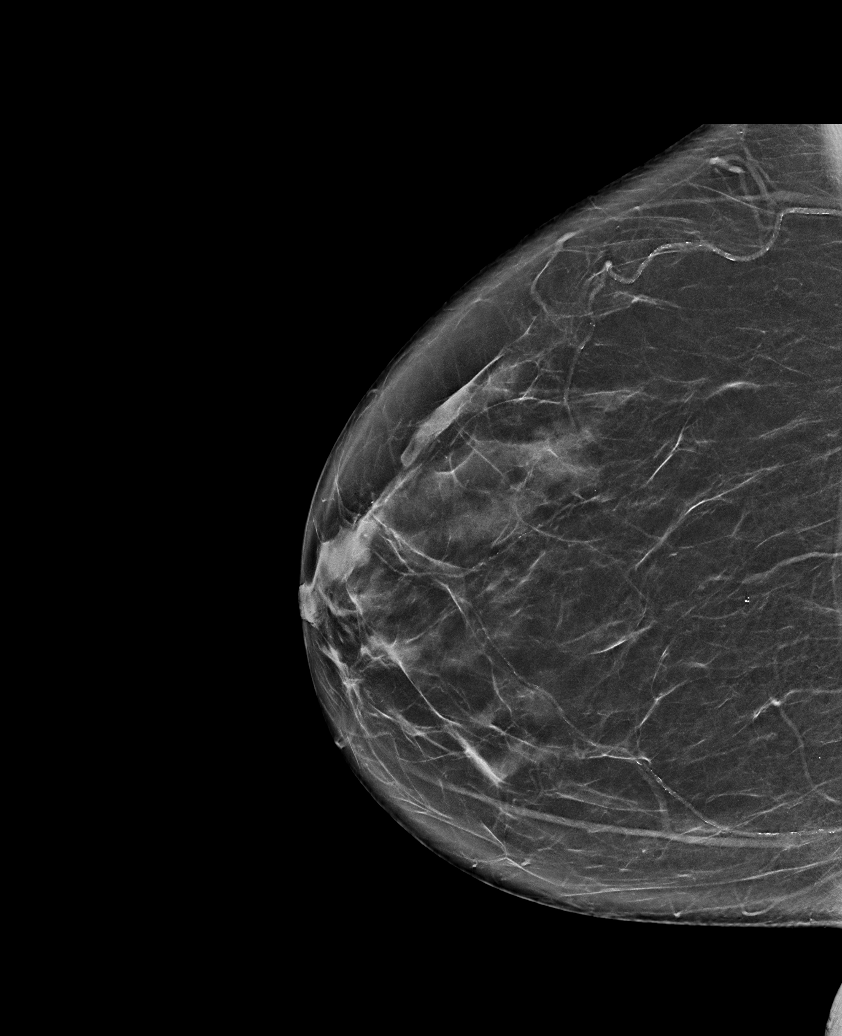

[R CC synth-2D (2 of 2)]
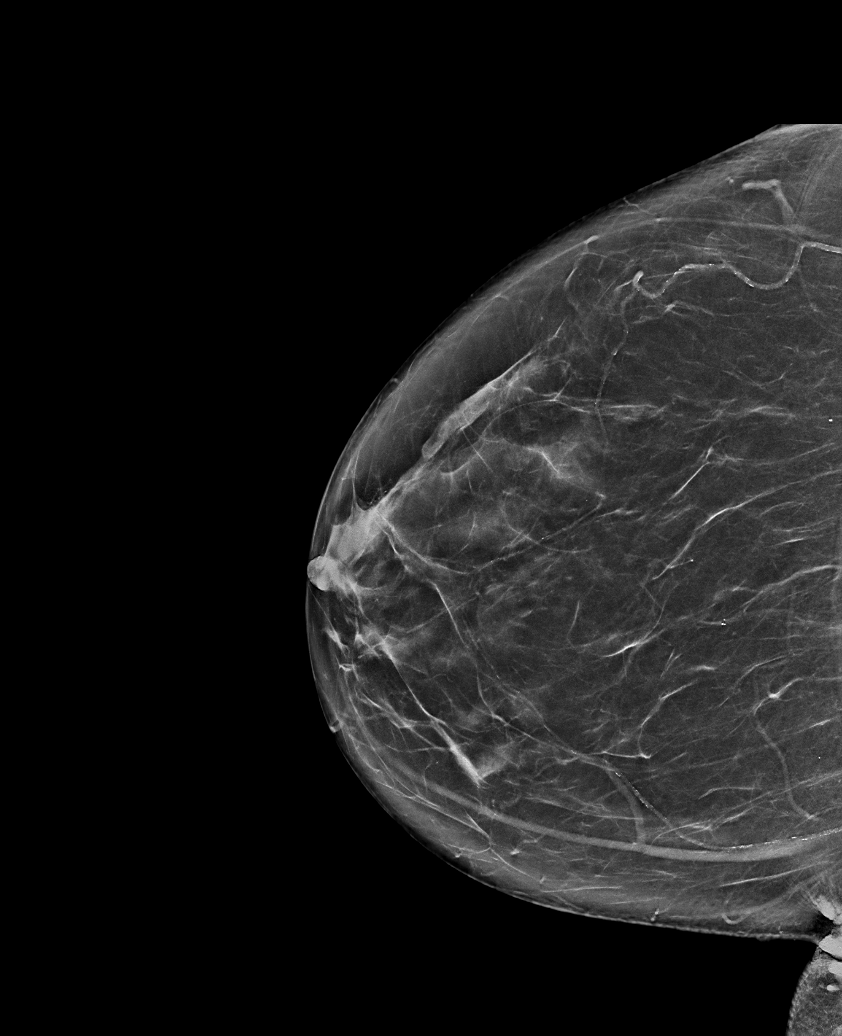

[L MLO synth-2D]
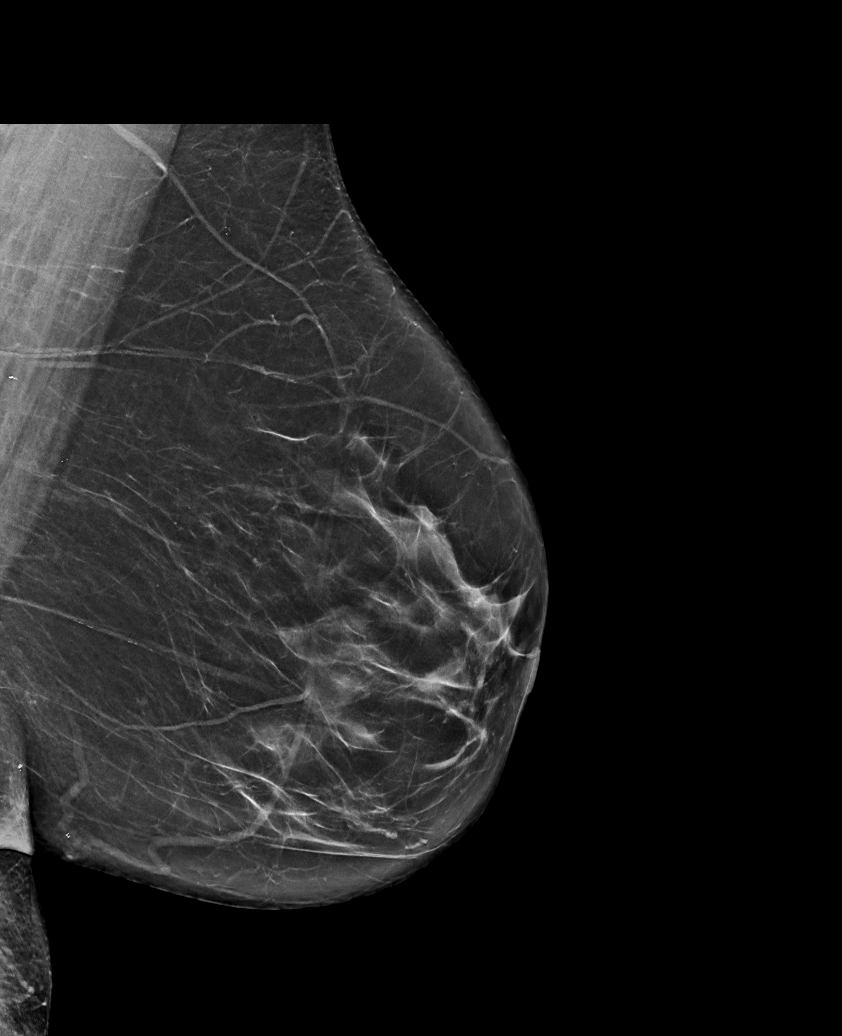

[L CC synth-2D]
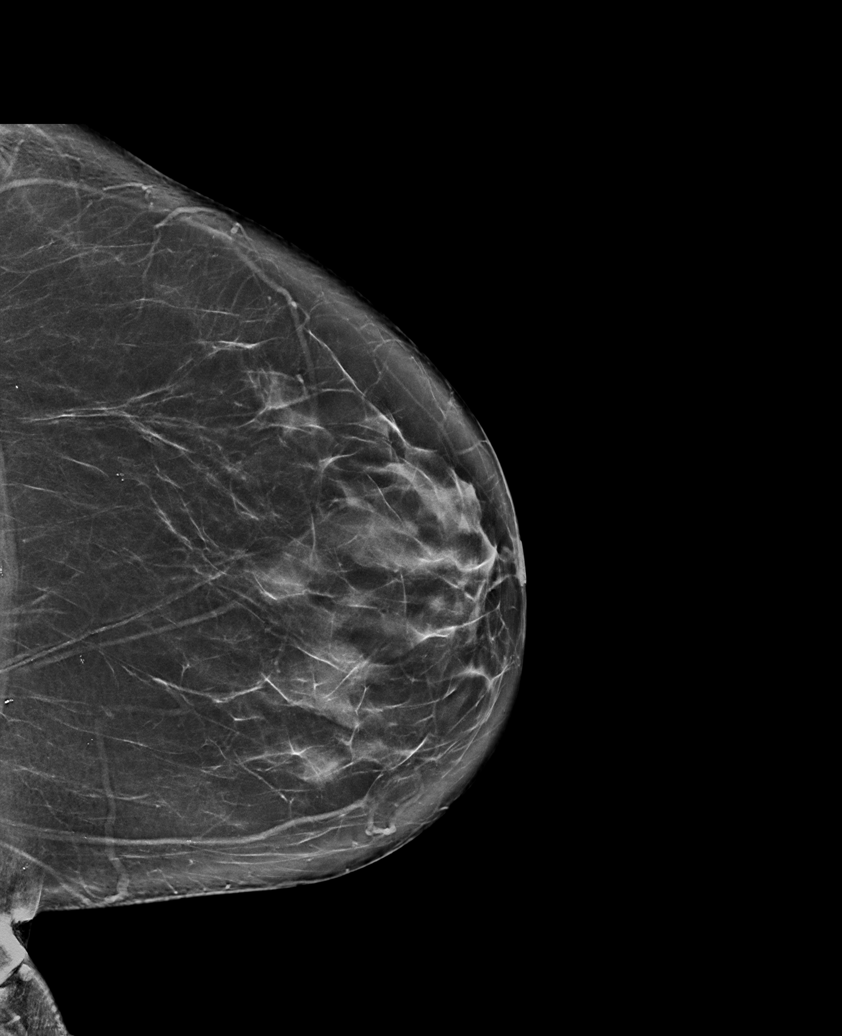

[R MLO synth-2D]
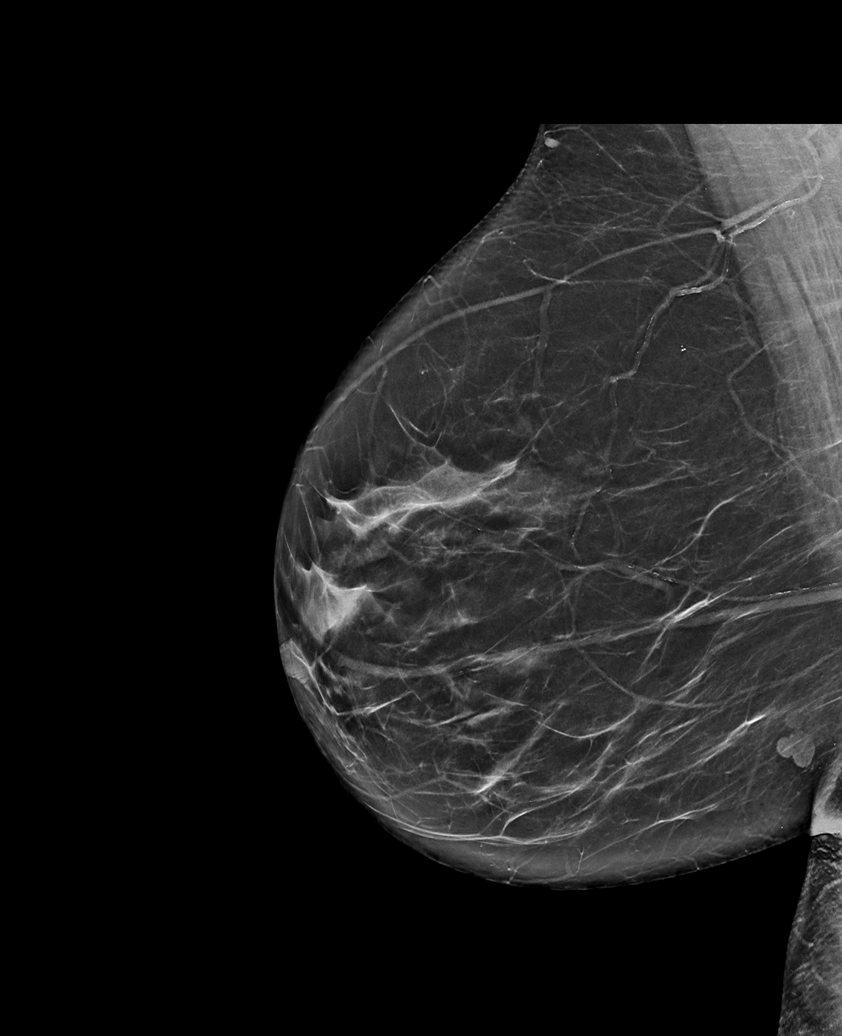

[L MLO tomo · tomo slice 39/76.0]
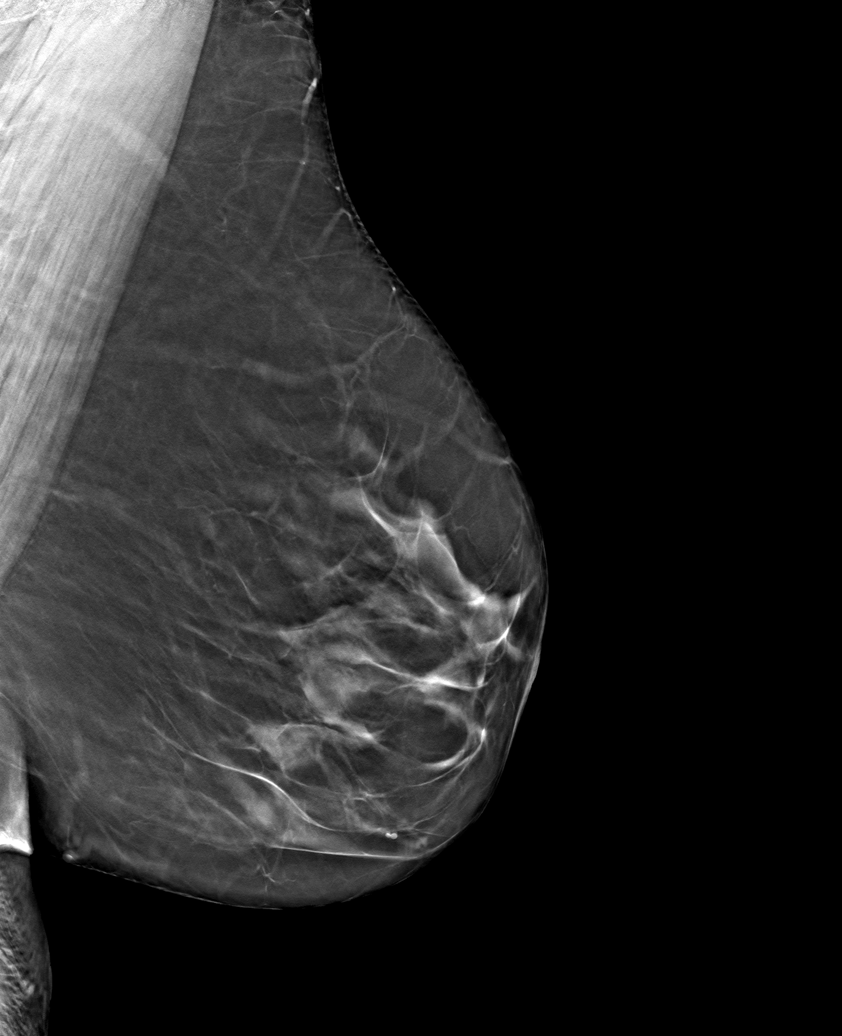

[6 of 30 positions shown; findings below may reference images not displayed]

ACR Breast Density Category b: There are scattered areas of
fibroglandular density.
FINDINGS: There are no findings suspicious for malignancy.
IMPRESSION: No mammographic evidence of malignancy. A result letter of this
screening mammogram will be mailed directly to the patient.

RECOMMENDATION:
Screening mammogram in one year. (Code:51-O-LD2)

BI-RADS CATEGORY  1: Negative.

## 2022-09-24 ENCOUNTER — Other Ambulatory Visit (INDEPENDENT_AMBULATORY_CARE_PROVIDER_SITE_OTHER)

## 2022-09-24 ENCOUNTER — Ambulatory Visit (INDEPENDENT_AMBULATORY_CARE_PROVIDER_SITE_OTHER): Admitting: Orthopedic Surgery

## 2022-09-24 DIAGNOSIS — S32592A Other specified fracture of left pubis, initial encounter for closed fracture: Secondary | ICD-10-CM

## 2022-09-24 NOTE — Progress Notes (Signed)
Orthopedic Surgery Progress Note     Assessment: Patient is a 62 y.o. female with pubic rami fractures (~6 months from injury)     Plan: -No acute operative intervention -No specific restrictions for her rami fractures -Weight bearing status: as tolerated bilaterally -OTC pain medications for pain relief -Work note provided today -Follow up PRN   ___________________________________________________________________________   Subjective: Patient is doing well.  She is not having any pain with regular activity.  She sometimes feels a soreness around her groin if she is particular active.  This resolves with rest.  She is back at work and is doing everything that she wants to do at this point.  Denies numbness and paresthesias.     Physical Exam:   General: no acute distress, appears stated age, ambulates without assistive devices, nonantalgic gait Neurologic: alert, answering questions appropriately, following commands Respiratory: unlabored breathing on room air, symmetric chest rise Psychiatric: appropriate affect, normal cadence to speech   MSK:    -Bilateral lower extremities             No tenderness to palpation over the extremity, no gross deformity             No pain with log roll bilaterally EHL/TA/GSC intact Plantarflexes and dorsiflexes toes Sensation intact to light touch in sural, saphenous, tibial, deep peroneal, and superficial peroneal nerve distributions Foot warm and well perfused     XR of the pelvis from 09/24/2022 were reviewed today showing rami fractures with interval callus formation.  Alignment appears similar to prior films.  No new fracture seen.   Patient name: Crystal Villa Patient MRN: [id number] Date: 09/24/22

## 2023-01-17 ENCOUNTER — Ambulatory Visit
Admission: RE | Admit: 2023-01-17 | Discharge: 2023-01-17 | Disposition: A | Discharge status: Home / Self Care | Payer: BC Managed Care – PPO | Source: Ambulatory Visit | Attending: Nurse Practitioner | Admitting: Nurse Practitioner

## 2023-01-17 VITALS — BP 142/90 | HR 101 | Temp 98.1°F | Resp 16

## 2023-01-17 DIAGNOSIS — H538 Other visual disturbances: Secondary | ICD-10-CM

## 2023-01-17 DIAGNOSIS — R519 Headache, unspecified: Secondary | ICD-10-CM

## 2023-01-17 MED ORDER — DEXAMETHASONE SODIUM PHOSPHATE 10 MG/ML IJ SOLN
10.0000 mg | Freq: Once | INTRAMUSCULAR | Status: AC
  Administered 2023-01-17: 10 mg via INTRAMUSCULAR

## 2023-01-17 NOTE — Discharge Instructions (Signed)
We have given you a shot of steroid medicine to help with the headache today.  You can continue the Tylenol for the headache.  Please plan to follow-up with a primary care provider in the next 1 to 2 weeks if the symptoms do not improve.  Seek care in the emergency room if your symptoms do not improve or if they worsen.

## 2023-01-17 NOTE — ED Provider Notes (Addendum)
[facility]    CSN: [id number] Arrival date & time: 01/17/23  1005      History   Chief Complaint Chief Complaint  Patient presents with   Headache    Some sinus pressure, headache real bad ,problem with vision - Entered by patient   Diplopia    HPI Crystal Villa is a 62 y.o. female.   Patient presents today 2-week history of double vision and headache.  Her symptoms are worse when she is driving.  Denies any current double vision, but does have a slight headache.  Headache feels like there is tension around her head.  Reports history of headaches, usually takes [facility] Goody powder would be an off and the pain gets better.  She took bc goody powder earlier today and Tylenol sinus which helped with the headache some.  She did not have room spinning sensation, passing out, or vision going black.  No recent fall or head injury.  No recent cough, congestion, or sore throat.  Reports that she blew her nose once and had a little bit of discolored mucus and so thought may be keeping due to sinus infection.  No nausea, vomiting, hearing loss.  No photophobia, unsteady gait, or postural instability.  No trouble with speech or trouble swallowing, weakness, chest pain, shortness of breath, pallor, or diaphoresis.   Reports she had her eyes checked earlier this year and has new prescription eyeglasses.    Past Medical History:  Diagnosis Date   Allergy    Arthritis    COPD (chronic obstructive pulmonary disease) (HCC)    GERD (gastroesophageal reflux disease)    Hepatitis C     Patient Active Problem List   Diagnosis Date Noted   Pelvis fracture (HCC) 04/16/2022   Closed fracture of multiple pubic rami, left, initial encounter (HCC) 04/16/2022   Chronic hepatitis C without hepatic coma (HCC) 09/10/2021    Past Surgical History:  Procedure Laterality Date   ABDOMINAL HYSTERECTOMY     BACK SURGERY     SPINE SURGERY      OB History   No obstetric history on file.       Home Medications    Prior to Admission medications   Medication Sig Start Date End Date Taking? Authorizing Provider  fluticasone (FLONASE) 50 MCG/ACT nasal spray Place 1 spray into both nostrils 2 (two) times daily. 12/17/21  Yes Particia Nearing, PA-C  levalbuterol [facility]) 45 MCG/ACT inhaler Inhale 1-2 puffs into the lungs every 4 (four) hours as needed for wheezing. 07/15/21  Yes Janeece Agee, NP  OVER THE COUNTER MEDICATION Goody's powder, one as needed.   Yes [provider]  OVER THE COUNTER MEDICATION Tylenol one capsule as needed.   Yes [provider]  pantoprazole (PROTONIX) 40 MG tablet TAKE 1 TABLET BY MOUTH EVERY DAY 10/12/21  Yes Janeece Agee, NP  HYDROcodone-acetaminophen (NORCO) 5-325 MG tablet Take 1-2 tablets by mouth every 6 (six) hours as needed for moderate pain. 04/16/22   Persons, [place], PA  OVER THE COUNTER MEDICATION Excedrine, one tablet as needed.    [provider]  senna-docusate (SENOKOT-S) 8.6-50 MG tablet Take 1 tablet by mouth daily. 03/23/22   Leath-Warren, Sadie Haber, NP  sucralfate (CARAFATE) 1 g tablet Take 1 tablet (1 g total) by mouth 3 (three) times daily as needed. Patient not taking: Reported on 03/26/2022 06/08/21   Particia Nearing, PA-C  umeclidinium-vilanterol [facility] ELLIPTA) 62.5-25 MCG/ACT AEPB Inhale 1 puff into the lungs  once daily. 03/23/22   Leath-Warren, Sadie Haber, NP  cetirizine (ZYRTEC ALLERGY) 10 MG tablet Take 1 tablet (10 mg total) by mouth daily. 10/02/19 01/14/20  AvegnoZachery Dakins, FNP    Family History Family History  Problem Relation Age of Onset   Healthy Mother    Hyperlipidemia Father    Colon cancer Maternal Grandmother    Esophageal cancer Neg Hx    Stomach cancer Neg Hx    Rectal cancer Neg Hx     Social History Social History   Tobacco Use   Smoking status: Former    Current packs/day: 0.00    Average packs/day: 1 pack/day for 15.0 years (15.0 ttl pk-yrs)     Types: Cigarettes, Cigars    Start date: 04/21/2002    Quit date: 04/21/2017    Years since quitting: 5.7   Smokeless tobacco: Former  Building services engineer status: Never Used  Substance Use Topics   Alcohol use: Never   Drug use: Never     Allergies   Patient has no known allergies.   Review of Systems Review of Systems Per HPI  Physical Exam Triage Vital Signs ED Triage Vitals  Encounter Vitals Group     BP 01/17/23 1031 (!) 142/90     Systolic BP Percentile --      Diastolic BP Percentile --      Pulse Rate 01/17/23 1031 (!) 101     Resp 01/17/23 1031 16     Temp 01/17/23 1031 98.1 F (36.7 C)     Temp Source 01/17/23 1031 Oral     SpO2 01/17/23 1031 96 %     Weight --      Height --      Head Circumference --      Peak Flow --      Pain Score 01/17/23 1032 7     Pain Loc --      Pain Education --      Exclude from Growth Chart --    No data found.  Updated Vital Signs BP (!) 142/90 (BP Location: Right Arm)   Pulse (!) 101   Temp 98.1 F (36.7 C) (Oral)   Resp 16   SpO2 96%   Visual Acuity Right Eye Distance: 20/30 Left Eye Distance: 20/30 Bilateral Distance: 20/30 (wears glassses)  Right Eye Near:   Left Eye Near:    Bilateral Near:     Physical Exam Vitals and nursing note reviewed.  Constitutional:      General: She is not in acute distress.    Appearance: Normal appearance. She is not toxic-appearing.  HENT:     Head: Normocephalic and atraumatic.     Right Ear: Tympanic membrane, ear canal and external ear normal. There is no impacted cerumen.     Left Ear: Tympanic membrane, ear canal and external ear normal. There is no impacted cerumen.     Nose: Nose normal. No congestion or rhinorrhea.     Mouth/Throat:     Mouth: Mucous membranes are moist.     Pharynx: Oropharynx is clear. No posterior oropharyngeal erythema.  Eyes:     General: No scleral icterus.    Extraocular Movements: Extraocular movements intact.     Right eye: Normal  extraocular motion.     Left eye: Normal extraocular motion.     Pupils: Pupils are equal, round, and reactive to light.  Cardiovascular:     Rate and Rhythm: Normal rate and regular rhythm.  Pulmonary:  Effort: Pulmonary effort is normal. No respiratory distress.     Breath sounds: Normal breath sounds. No wheezing, rhonchi or rales.  Musculoskeletal:     Cervical back: Normal range of motion.  Lymphadenopathy:     Cervical: No cervical adenopathy.  Skin:    General: Skin is warm and dry.     Capillary Refill: Capillary refill takes less than 2 seconds.     Coloration: Skin is not jaundiced or pale.     Findings: No erythema.  Neurological:     General: No focal deficit present.     Mental Status: She is alert and oriented to person, place, and time.     Cranial Nerves: Cranial nerves 2-12 are intact. No cranial nerve deficit, dysarthria or facial asymmetry.     Sensory: Sensation is intact. No sensory deficit.     Motor: Motor function is intact.     Coordination: Coordination is intact. Romberg sign negative. Coordination normal. Heel to [facility] Test normal. Rapid alternating movements normal.     Gait: Gait is intact. Gait normal.  Psychiatric:        Speech: Speech normal.        Behavior: Behavior is cooperative.      UC Treatments / Results  Labs (all labs ordered are listed, but only abnormal results are displayed) Labs Reviewed - No data to display  EKG   Radiology No results found.  Procedures Procedures (including critical care time)  Medications Ordered in UC Medications  dexamethasone (DECADRON) injection 10 mg (10 mg Intramuscular Given 01/17/23 1105)    Initial Impression / Assessment and Plan / UC Course  I have reviewed the triage vital signs and the nursing notes.  Pertinent labs & imaging results that were available during my care of the patient were reviewed by me and considered in my medical decision making (see chart for details).   Patient  is well-appearing, afebrile, not tachypneic, oxygenating well on room air.  Patient is mildly tachycardic and hypertensive in triage today.  1. Blurred vision, bilateral 2. Acute nonintractable headache, unspecified headache type Given length of symptoms, low suspicion for acute stroke Additionally, vital signs and examination today are reassuring Will provide IM Decadron 10 mg to help with headache Other supportive care discussed at home Recommended establishing care with PCP for follow-up and I asked one of the nurses today to help make an appointment Strict ER precautions discussed with patient  The patient was given the opportunity to ask questions.  All questions answered to their satisfaction.  The patient is in agreement to this plan.    Final Clinical Impressions(s) / UC Diagnoses   Final diagnoses:  Blurred vision, bilateral  Acute nonintractable headache, unspecified headache type     Discharge Instructions      We have given you a shot of steroid medicine to help with the headache today.  You can continue the Tylenol for the headache.  Please plan to follow-up with a primary care provider in the next 1 to 2 weeks if the symptoms do not improve.  Seek care in the emergency room if your symptoms do not improve or if they worsen.   ED Prescriptions   None    PDMP not reviewed this encounter.   Valentino Nose, NP 01/17/23 1152    Valentino Nose, NP 01/17/23 1153

## 2023-01-17 NOTE — ED Triage Notes (Signed)
Headache with double vision, cough, sneezing, heartburn, nausea that started 2 days ago. Taking tylenol sinus with no relief.

## 2023-01-18 ENCOUNTER — Emergency Department (HOSPITAL_COMMUNITY): Payer: BC Managed Care – PPO

## 2023-01-18 ENCOUNTER — Other Ambulatory Visit: Payer: Self-pay

## 2023-01-18 ENCOUNTER — Encounter (HOSPITAL_COMMUNITY): Payer: Self-pay | Admitting: Emergency Medicine

## 2023-01-18 ENCOUNTER — Emergency Department (HOSPITAL_COMMUNITY)
Admission: EM | Admit: 2023-01-18 | Discharge: 2023-01-18 | Disposition: A | Discharge status: Home / Self Care | Payer: BC Managed Care – PPO | Attending: Emergency Medicine | Admitting: Emergency Medicine

## 2023-01-18 DIAGNOSIS — R519 Headache, unspecified: Secondary | ICD-10-CM | POA: Diagnosis not present

## 2023-01-18 DIAGNOSIS — G43001 Migraine without aura, not intractable, with status migrainosus: Secondary | ICD-10-CM

## 2023-01-18 DIAGNOSIS — G43009 Migraine without aura, not intractable, without status migrainosus: Secondary | ICD-10-CM | POA: Insufficient documentation

## 2023-01-18 DIAGNOSIS — D72829 Elevated white blood cell count, unspecified: Secondary | ICD-10-CM | POA: Insufficient documentation

## 2023-01-18 LAB — CBC WITH DIFFERENTIAL/PLATELET
Abs Immature Granulocytes: 0.1 10*3/uL — ABNORMAL HIGH (ref 0.00–0.07)
Basophils Absolute: 0 10*3/uL (ref 0.0–0.1)
Basophils Relative: 0 %
Eosinophils Absolute: 0 10*3/uL (ref 0.0–0.5)
Eosinophils Relative: 0 %
HCT: 42.8 % (ref 36.0–46.0)
Hemoglobin: 14.8 g/dL (ref 12.0–15.0)
Immature Granulocytes: 1 %
Lymphocytes Relative: 12 %
Lymphs Abs: 2.3 10*3/uL (ref 0.7–4.0)
MCH: 31.2 pg (ref 26.0–34.0)
MCHC: 34.6 g/dL (ref 30.0–36.0)
MCV: 90.1 fL (ref 80.0–100.0)
Monocytes Absolute: 1.1 10*3/uL — ABNORMAL HIGH (ref 0.1–1.0)
Monocytes Relative: 6 %
Neutro Abs: 15.2 10*3/uL — ABNORMAL HIGH (ref 1.7–7.7)
Neutrophils Relative %: 81 %
Platelets: 312 10*3/uL (ref 150–400)
RBC: 4.75 MIL/uL (ref 3.87–5.11)
RDW: 12.9 % (ref 11.5–15.5)
WBC: 18.8 10*3/uL — ABNORMAL HIGH (ref 4.0–10.5)
nRBC: 0 % (ref 0.0–0.2)

## 2023-01-18 LAB — COMPREHENSIVE METABOLIC PANEL
ALT: 18 U/L (ref 0–44)
AST: 20 U/L (ref 15–41)
Albumin: 4.2 g/dL (ref 3.5–5.0)
Alkaline Phosphatase: 79 U/L (ref 38–126)
Anion gap: 14 (ref 5–15)
BUN: 30 mg/dL — ABNORMAL HIGH (ref 6–20)
CO2: 22 mmol/L (ref 22–32)
Calcium: 10.3 mg/dL (ref 8.9–10.3)
Chloride: 100 mmol/L (ref 98–111)
Creatinine, Ser: 0.52 mg/dL (ref 0.44–1.00)
GFR, Estimated: >60 mL/min (ref >60)
Glucose, Bld: 118 mg/dL — ABNORMAL HIGH (ref 70–99)
Potassium: 3.5 mmol/L (ref 3.5–5.1)
Sodium: 136 mmol/L (ref 135–145)
Total Bilirubin: 0.4 mg/dL (ref 0.3–1.2)
Total Protein: 7.6 g/dL (ref 6.5–8.1)

## 2023-01-18 MED ORDER — KETOROLAC TROMETHAMINE 30 MG/ML IJ SOLN
30.0000 mg | Freq: Once | INTRAMUSCULAR | Status: AC
  Administered 2023-01-18: 30 mg via INTRAVENOUS
  Filled 2023-01-18: qty 1

## 2023-01-18 MED ORDER — ONDANSETRON 4 MG PO TBDP: ORAL_TABLET | ORAL | 0 refills | Status: DC

## 2023-01-18 MED ORDER — METOCLOPRAMIDE HCL 5 MG/ML IJ SOLN
10.0000 mg | Freq: Once | INTRAMUSCULAR | Status: AC
  Administered 2023-01-18: 10 mg via INTRAVENOUS
  Filled 2023-01-18: qty 2

## 2023-01-18 MED ORDER — OXYCODONE-ACETAMINOPHEN 5-325 MG PO TABS
1.0000 | ORAL_TABLET | Freq: Four times a day (QID) | ORAL | 0 refills | Status: DC | PRN
Start: 2023-01-18 — End: 2023-06-17

## 2023-01-18 MED ORDER — DIPHENHYDRAMINE HCL 50 MG/ML IJ SOLN
25.0000 mg | Freq: Once | INTRAMUSCULAR | Status: AC
  Administered 2023-01-18: 25 mg via INTRAVENOUS
  Filled 2023-01-18: qty 1

## 2023-01-18 NOTE — ED Triage Notes (Signed)
Has been having blurred vision in both eyes for months. Bought new glasses and helped but then started getting worse. Pt c/o headache x 3 days. Usually can take a goody and goes away but nothing working. Blurred vision much worse since the headache started. Denies trouble speaking/denies weakness on one side. Pt a/o. Smile symmetrical. Ambulated to room form triage with steady gait.

## 2023-01-18 NOTE — Discharge Instructions (Signed)
Drink plenty of fluids.  Make an appointment to follow-up with neurology in the next couple weeks.  If you get worse or have any problems you should go to [facility] for evaluation

## 2023-01-20 ENCOUNTER — Encounter: Payer: Self-pay | Admitting: Neurology

## 2023-01-20 NOTE — ED Provider Notes (Signed)
[place] EMERGENCY DEPARTMENT AT [facility] Provider Note   CSN: [id number] Arrival date & time: 01/18/23  0944     History  Chief Complaint  Patient presents with   Blurred Vision   Headache    Crystal Villa is a 62 y.o. female.  Patient complains of a headache for a 2 weeks.  No fevers no chills no neck pain.  Patient also complains of some blurred vision  The history is provided by the patient and medical records. No language interpreter was used.  Headache Pain location:  Generalized Quality:  Dull Radiates to:  Does not radiate Severity currently:  7/10 Severity at highest:  9/10 Onset quality:  Sudden Timing:  Constant Progression:  Waxing and waning Chronicity:  New Context: not activity   Relieved by:  Nothing Worsened by:  Nothing Associated symptoms: no abdominal pain, no back pain, no congestion, no cough, no diarrhea, no fatigue, no seizures and no sinus pressure        Home Medications Prior to Admission medications   Medication Sig Start Date End Date Taking? Authorizing Provider  ondansetron (ZOFRAN-ODT) 4 MG disintegrating tablet 4mg  ODT q4 hours prn nausea/vomit 01/18/23  Yes Bethann Berkshire, MD  oxyCODONE-acetaminophen (PERCOCET/ROXICET) 5-325 MG tablet Take 1 tablet by mouth every 6 (six) hours as needed for severe pain. 01/18/23  Yes Bethann Berkshire, MD  fluticasone (FLONASE) 50 MCG/ACT nasal spray Place 1 spray into both nostrils 2 (two) times daily. 12/17/21   Particia Nearing, PA-C  HYDROcodone-acetaminophen (NORCO) 5-325 MG tablet Take 1-2 tablets by mouth every 6 (six) hours as needed for moderate pain. 04/16/22   Persons, [place], PA  levalbuterol [facility]) 45 MCG/ACT inhaler Inhale 1-2 puffs into the lungs every 4 (four) hours as needed for wheezing. 07/15/21   Janeece Agee, NP  OVER THE COUNTER MEDICATION Goody's powder, one as needed.    [provider]  OVER THE COUNTER MEDICATION Tylenol one capsule as  needed.    [provider]  OVER THE COUNTER MEDICATION Excedrine, one tablet as needed.    [provider]  pantoprazole (PROTONIX) 40 MG tablet TAKE 1 TABLET BY MOUTH EVERY DAY 10/12/21   Janeece Agee, NP  senna-docusate (SENOKOT-S) 8.6-50 MG tablet Take 1 tablet by mouth daily. 03/23/22   Leath-Warren, Sadie Haber, NP  sucralfate (CARAFATE) 1 g tablet Take 1 tablet (1 g total) by mouth 3 (three) times daily as needed. Patient not taking: Reported on 03/26/2022 06/08/21   Particia Nearing, PA-C  umeclidinium-vilanterol [facility] ELLIPTA) 62.5-25 MCG/ACT AEPB Inhale 1 puff into the lungs once daily. 03/23/22   Leath-Warren, Sadie Haber, NP  cetirizine (ZYRTEC ALLERGY) 10 MG tablet Take 1 tablet (10 mg total) by mouth daily. 10/02/19 01/14/20  Durward Parcel, FNP      Allergies    Patient has no known allergies.    Review of Systems   Review of Systems  Constitutional:  Negative for appetite change and fatigue.  HENT:  Negative for congestion, ear discharge and sinus pressure.   Eyes:  Negative for discharge.  Respiratory:  Negative for cough.   Cardiovascular:  Negative for chest pain.  Gastrointestinal:  Negative for abdominal pain and diarrhea.  Genitourinary:  Negative for frequency and hematuria.  Musculoskeletal:  Negative for back pain.  Skin:  Negative for rash.  Neurological:  Positive for headaches. Negative for seizures.  Psychiatric/Behavioral:  Negative for hallucinations.     Physical Exam Updated Vital Signs BP 135/78 (  BP Location: Right Arm)   Pulse 71   Temp 98.7 F (37.1 C) (Oral)   Resp 18   SpO2 97%  Physical Exam Vitals and nursing note reviewed.  Constitutional:      Appearance: She is well-developed.  HENT:     Head: Normocephalic.     Nose: Nose normal.  Eyes:     General: No scleral icterus.    Conjunctiva/sclera: Conjunctivae normal.  Neck:     Thyroid: No thyromegaly.  Cardiovascular:     Rate and Rhythm: Normal rate and  regular rhythm.     Heart sounds: No murmur heard.    No friction rub. No gallop.  Pulmonary:     Breath sounds: No stridor. No wheezing or rales.  Chest:     Chest wall: No tenderness.  Abdominal:     General: There is no distension.     Tenderness: There is no abdominal tenderness. There is no rebound.  Musculoskeletal:        General: Normal range of motion.     Cervical back: Neck supple.  Lymphadenopathy:     Cervical: No cervical adenopathy.  Skin:    Findings: No erythema or rash.  Neurological:     Mental Status: She is alert and oriented to person, place, and time.     Motor: No abnormal muscle tone.     Coordination: Coordination normal.  Psychiatric:        Behavior: Behavior normal.     ED Results / Procedures / Treatments   Labs (all labs ordered are listed, but only abnormal results are displayed) Labs Reviewed  CBC WITH DIFFERENTIAL/PLATELET - Abnormal; Notable for the following components:      Result Value   WBC 18.8 (*)    Neutro Abs 15.2 (*)    Monocytes Absolute 1.1 (*)    Abs Immature Granulocytes 0.10 (*)    All other components within normal limits  COMPREHENSIVE METABOLIC PANEL - Abnormal; Notable for the following components:   Glucose, Bld 118 (*)    BUN 30 (*)    All other components within normal limits    EKG None  Radiology MR BRAIN WO CONTRAST  Result Date: 01/18/2023 CLINICAL DATA:  Headache, increasing frequency or severity. EXAM: MRI HEAD WITHOUT CONTRAST TECHNIQUE: Multiplanar, multiecho pulse sequences of the brain and surrounding structures were obtained without intravenous contrast. COMPARISON:  Head CT August 08, 2017. FINDINGS: Brain: No acute infarction, hemorrhage, hydrocephalus, extra-axial collection or mass lesion. Few foci of T2 hyperintensity are seen within the white cerebral hemispheres in a nonspecific distribution. Vascular: Normal flow voids. Skull and upper cervical spine: Normal marrow signal. Sinuses/Orbits:  Negative. Other: None. IMPRESSION: 1. No acute intracranial abnormality. 2. Few foci of T2 hyperintense lesions of the white matter in a nonspecific distribution. Differential diagnosis include migraine, demyelinating disease and the sequela of chronic microangiopathy. Electronically Signed   By: Baldemar Lenis M.D.   On: 01/18/2023 13:35    Procedures Procedures    Medications Ordered in ED Medications  ketorolac (TORADOL) 30 MG/ML injection 30 mg (30 mg Intravenous Given 01/18/23 1052)  metoCLOPramide (REGLAN) injection 10 mg (10 mg Intravenous Given 01/18/23 1052)  diphenhydrAMINE (BENADRYL) injection 25 mg (25 mg Intravenous Given 01/18/23 1050)    ED Course/ Medical Decision Making/ A&P  Medical Decision Making Amount and/or Complexity of Data Reviewed Labs: ordered. Radiology: ordered.  Risk Prescription drug management.  This patient presents to the ED for concern of headache, this involves an extensive number of treatment options, and is a complaint that carries with it a high risk of complications and morbidity.  The differential diagnosis includes migraine, stroke, brain mass   Co morbidities that complicate the patient evaluation  Obesity   Additional history obtained:  Additional history obtained from patient External records from outside source obtained and reviewed including hospital records   Lab Tests:  I Ordered, and personally interpreted labs.  The pertinent results include: White count mildly elevated 18.8   Imaging Studies ordered:  I ordered imaging studies including MRI of the brain I independently visualized and interpreted imaging which showed negative I agree with the radiologist interpretation   Cardiac Monitoring: / EKG:  The patient was maintained on a cardiac monitor.  I personally viewed and interpreted the cardiac monitored which showed an underlying rhythm of: Normal sinus  rhythm   Consultations Obtained:  No consultant  Problem List / ED Course / Critical interventions / Medication management  Migraine headache I ordered medication including migraine cocktail for headache Reevaluation of the patient after these medicines showed that the patient improved I have reviewed the patients home medicines and have made adjustments as needed   Social Determinants of Health:  None   Test / Admission - Considered:  None  Patient with a migraine headache.  She improved with migraine cocktail and is referred to neurology        Final Clinical Impression(s) / ED Diagnoses Final diagnoses:  Migraine without aura and with status migrainosus, not intractable    Rx / DC Orders ED Discharge Orders          Ordered    ondansetron (ZOFRAN-ODT) 4 MG disintegrating tablet        01/18/23 1454    oxyCODONE-acetaminophen (PERCOCET/ROXICET) 5-325 MG tablet  Every 6 hours PRN        01/18/23 1454    Ambulatory referral to Neurology       Comments: An appointment is requested in approximately: 1 week   01/18/23 1455              Bethann Berkshire, MD 01/20/23 [phone]

## 2023-01-21 DIAGNOSIS — Z0100 Encounter for examination of eyes and vision without abnormal findings: Secondary | ICD-10-CM | POA: Diagnosis not present

## 2023-01-21 DIAGNOSIS — H43813 Vitreous degeneration, bilateral: Secondary | ICD-10-CM | POA: Diagnosis not present

## 2023-01-21 DIAGNOSIS — H2513 Age-related nuclear cataract, bilateral: Secondary | ICD-10-CM | POA: Diagnosis not present

## 2023-02-10 NOTE — Patient Instructions (Signed)

## 2023-02-10 NOTE — Progress Notes (Signed)
New Patient Office Visit   Subjective   Patient ID: Crystal Villa, female    DOB: 09/18/1960  Age: 62 y.o. MRN: [id number]  CC:  Chief Complaint  Patient presents with   Establish Care    Diagnosed on 10/01 at [facility] w/ Migraines. Migraines are causing blurry vision.  COPD medication, other medication refills.     HPI Crystal Villa 62 year old female, presents to establish care. She  has a past medical history of Allergy, Arthritis, COPD (chronic obstructive pulmonary disease) (HCC), GERD (gastroesophageal reflux disease), and Hepatitis C.  Migraine  The patient reports a chronic migraine condition, experiencing migraines 3-4 times per week, which has been progressively worsening. The pain is constant and affects the occipital, bilateral, and frontal regions. Unlike previous headaches, the pain is described as aching, pulsating, and throbbing, with an intensity of 10/10. Associated symptoms include blurred vision, eye pain, and visual changes. She denies dizziness, loss of balance, or scalp tenderness. Physical activity worsens the symptoms. The patient has tried acetaminophen and NSAIDs, which have provided only mild relief. Her medical history is notable for migraines, and there is no recent history of head trauma      Outpatient Encounter Medications as of 02/11/2023  Medication Sig   ondansetron (ZOFRAN-ODT) 4 MG disintegrating tablet 4mg  ODT q4 hours prn nausea/vomit   OVER THE COUNTER MEDICATION Goody's powder, one as needed.   OVER THE COUNTER MEDICATION Tylenol one capsule as needed.   OVER THE COUNTER MEDICATION Excedrine, one tablet as needed.   pantoprazole (PROTONIX) 40 MG tablet TAKE 1 TABLET BY MOUTH EVERY DAY   SUMAtriptan (IMITREX) 25 MG tablet Take 1 tablet (25 mg total) by mouth every 12 (twelve) hours as needed for migraine. May repeat in 2 hours if headache persists or recurs.   [DISCONTINUED] levalbuterol (XOPENEX HFA) 45 MCG/ACT inhaler Inhale 1-2 puffs into the  lungs every 4 (four) hours as needed for wheezing.   [DISCONTINUED] topiramate (TOPAMAX) 25 MG tablet Take 1 tablet (25 mg total) by mouth daily.   [DISCONTINUED] umeclidinium-vilanterol (ANORO ELLIPTA) 62.5-25 MCG/ACT AEPB Inhale 1 puff into the lungs once daily.   fluticasone (FLONASE) 50 MCG/ACT nasal spray Place 1 spray into both nostrils 2 (two) times daily. (Patient not taking: Reported on 02/11/2023)   HYDROcodone-acetaminophen (NORCO) 5-325 MG tablet Take 1-2 tablets by mouth every 6 (six) hours as needed for moderate pain. (Patient not taking: Reported on 02/11/2023)   levalbuterol (XOPENEX HFA) 45 MCG/ACT inhaler Inhale 1-2 puffs into the lungs every 4 (four) hours as needed for wheezing.   oxyCODONE-acetaminophen (PERCOCET/ROXICET) 5-325 MG tablet Take 1 tablet by mouth every 6 (six) hours as needed for severe pain. (Patient not taking: Reported on 02/11/2023)   senna-docusate (SENOKOT-S) 8.6-50 MG tablet Take 1 tablet by mouth daily. (Patient not taking: Reported on 02/11/2023)   sucralfate (CARAFATE) 1 g tablet Take 1 tablet (1 g total) by mouth 3 (three) times daily as needed. (Patient not taking: Reported on 03/26/2022)   topiramate (TOPAMAX) 25 MG tablet Take 1 tablet (25 mg total) by mouth daily.   umeclidinium-vilanterol (ANORO ELLIPTA) 62.5-25 MCG/ACT AEPB Inhale 1 puff into the lungs once daily.   [DISCONTINUED] cetirizine (ZYRTEC ALLERGY) 10 MG tablet Take 1 tablet (10 mg total) by mouth daily.   No facility-administered encounter medications on file as of 02/11/2023.    Past Surgical History:  Procedure Laterality Date   ABDOMINAL HYSTERECTOMY     BACK SURGERY     SPINE SURGERY  Review of Systems  Constitutional:  Negative for chills and fever.  Eyes:  Positive for blurred vision and pain.  Respiratory:  Negative for shortness of breath.   Cardiovascular:  Negative for chest pain.  Gastrointestinal:  Negative for abdominal pain.  Genitourinary:  Negative for  dysuria.  Musculoskeletal:  Negative for myalgias.  Neurological:  Positive for headaches.      Objective    BP 122/80   Pulse 86   Ht 5\' 4"  (1.626 m)   Wt 170 lb 0.6 oz (77.1 kg)   SpO2 96%   BMI 29.19 kg/m   Physical Exam Vitals reviewed.  Constitutional:      General: She is not in acute distress.    Appearance: Normal appearance. She is not ill-appearing, toxic-appearing or diaphoretic.  HENT:     Head: Normocephalic.     Right Ear: Tympanic membrane normal.     Left Ear: Tympanic membrane normal.     Nose: Nose normal.     Mouth/Throat:     Mouth: Mucous membranes are moist.  Eyes:     General:        Right eye: No discharge.        Left eye: No discharge.     Conjunctiva/sclera: Conjunctivae normal.     Pupils: Pupils are equal, round, and reactive to light.  Cardiovascular:     Rate and Rhythm: Normal rate.     Pulses: Normal pulses.     Heart sounds: Normal heart sounds.  Pulmonary:     Effort: Pulmonary effort is normal. No respiratory distress.     Breath sounds: Normal breath sounds.  Abdominal:     General: Bowel sounds are normal.     Palpations: Abdomen is soft.     Tenderness: There is no abdominal tenderness. There is no guarding.  Musculoskeletal:        General: Normal range of motion.     Cervical back: Normal range of motion.  Skin:    General: Skin is warm and dry.  Neurological:     Mental Status: She is alert.     Coordination: Coordination normal.     Gait: Gait normal.  Psychiatric:        Mood and Affect: Mood normal.        Behavior: Behavior normal.       Assessment & Plan:  Encounter for screening for cardiovascular disorders -     Microalbumin / creatinine urine ratio -     Lipid panel -     CMP14+EGFR -     CBC with Differential/Platelet  IFG (impaired fasting glucose) -     Hemoglobin A1c  TSH (thyroid-stimulating hormone deficiency) -     TSH + free T4  Vitamin D deficiency -     VITAMIN D 25 Hydroxy (Vit-D  Deficiency, Fractures)  Panlobular emphysema (HCC) -     Levalbuterol Tartrate; Inhale 1-2 puffs into the lungs every 4 (four) hours as needed for wheezing.  Dispense: 15 g; Refill: 2 -     Anoro Ellipta; Inhale 1 puff into the lungs once daily.  Dispense: 60 each; Refill: 2  History of COPD Assessment & Plan: Anoro-Ellipta 62.5-25 mcg Levalbuterol 45 mcg inhaler PRN Advise avoid triggers like smoke and pollution, and follow your medication regimen plan closely to help reduce symptoms and prevent flare-ups.   Chronic migraine without aura with status migrainosus, not intractable Assessment & Plan: Trial on Topamax 25 mg once daily Imitrex 25 PRN  Advise methods include deep breathing, cognitive behavioral therapy focusing on changing thoughts, Limit or avoid alcohol, caffeine, chocolate, canner foods, MSG and aspartame.  Implement sleep hygiene includes not watching TV or listen music in bed, don't eat heavy meals within a couple of hours of bedtime, don't use your phone, laptop, or tablet at bedtime   Follow up with Neurology    Other orders -     SUMAtriptan Succinate; Take 1 tablet (25 mg total) by mouth every 12 (twelve) hours as needed for migraine. May repeat in 2 hours if headache persists or recurs.  Dispense: 10 tablet; Refill: 1 -     Topiramate; Take 1 tablet (25 mg total) by mouth daily.  Dispense: 30 tablet; Refill: 2    Return in about 6 months (around 08/12/2023), or if symptoms worsen or fail to improve, for routine labs.   Cruzita Lederer Newman Nip, FNP

## 2023-02-11 ENCOUNTER — Other Ambulatory Visit: Payer: Self-pay | Admitting: Family Medicine

## 2023-02-11 ENCOUNTER — Ambulatory Visit: Payer: BC Managed Care – PPO | Admitting: Family Medicine

## 2023-02-11 VITALS — BP 122/80 | HR 86 | Ht 64.0 in | Wt 170.0 lb

## 2023-02-11 DIAGNOSIS — J431 Panlobular emphysema: Secondary | ICD-10-CM | POA: Diagnosis not present

## 2023-02-11 DIAGNOSIS — E559 Vitamin D deficiency, unspecified: Secondary | ICD-10-CM | POA: Diagnosis not present

## 2023-02-11 DIAGNOSIS — G43701 Chronic migraine without aura, not intractable, with status migrainosus: Secondary | ICD-10-CM

## 2023-02-11 DIAGNOSIS — R7301 Impaired fasting glucose: Secondary | ICD-10-CM

## 2023-02-11 DIAGNOSIS — G43909 Migraine, unspecified, not intractable, without status migrainosus: Secondary | ICD-10-CM | POA: Insufficient documentation

## 2023-02-11 DIAGNOSIS — Z8709 Personal history of other diseases of the respiratory system: Secondary | ICD-10-CM

## 2023-02-11 DIAGNOSIS — Z136 Encounter for screening for cardiovascular disorders: Secondary | ICD-10-CM

## 2023-02-11 DIAGNOSIS — E038 Other specified hypothyroidism: Secondary | ICD-10-CM

## 2023-02-11 MED ORDER — LEVALBUTEROL TARTRATE 45 MCG/ACT IN AERO: 1.0000 | INHALATION_SPRAY | RESPIRATORY_TRACT | 2 refills | Status: DC | PRN

## 2023-02-11 MED ORDER — SUMATRIPTAN SUCCINATE 25 MG PO TABS: 25.0000 mg | ORAL_TABLET | Freq: Two times a day (BID) | ORAL | 1 refills | Status: AC | PRN

## 2023-02-11 MED ORDER — TOPIRAMATE 25 MG PO TABS: 25.0000 mg | ORAL_TABLET | Freq: Every day | ORAL | 2 refills | Status: DC

## 2023-02-11 MED ORDER — ANORO ELLIPTA 62.5-25 MCG/ACT IN AEPB: INHALATION_SPRAY | RESPIRATORY_TRACT | 2 refills | Status: AC

## 2023-02-11 MED ORDER — TOPIRAMATE 25 MG PO TABS: 25.0000 mg | ORAL_TABLET | Freq: Every day | ORAL | 2 refills | Status: AC

## 2023-02-11 NOTE — Assessment & Plan Note (Signed)
Anoro-Ellipta 62.5-25 mcg Levalbuterol 45 mcg inhaler PRN Advise avoid triggers like smoke and pollution, and follow your medication regimen plan closely to help reduce symptoms and prevent flare-ups.

## 2023-02-11 NOTE — Assessment & Plan Note (Addendum)
Trial on Topamax 25 mg once daily Imitrex 25 PRN Advise methods include deep breathing, cognitive behavioral therapy focusing on changing thoughts, Limit or avoid alcohol, caffeine, chocolate, canner foods, MSG and aspartame.  Implement sleep hygiene includes not watching TV or listen music in bed, don't eat heavy meals within a couple of hours of bedtime, don't use your phone, laptop, or tablet at bedtime   Follow up with Neurology

## 2023-02-14 ENCOUNTER — Other Ambulatory Visit: Payer: Self-pay | Admitting: Family Medicine

## 2023-02-14 MED ORDER — ALBUTEROL SULFATE HFA 108 (90 BASE) MCG/ACT IN AERS: 2.0000 | INHALATION_SPRAY | Freq: Four times a day (QID) | RESPIRATORY_TRACT | 2 refills | Status: DC | PRN

## 2023-02-18 DIAGNOSIS — E559 Vitamin D deficiency, unspecified: Secondary | ICD-10-CM | POA: Diagnosis not present

## 2023-02-18 DIAGNOSIS — E038 Other specified hypothyroidism: Secondary | ICD-10-CM | POA: Diagnosis not present

## 2023-02-18 DIAGNOSIS — R7301 Impaired fasting glucose: Secondary | ICD-10-CM | POA: Diagnosis not present

## 2023-02-18 DIAGNOSIS — Z136 Encounter for screening for cardiovascular disorders: Secondary | ICD-10-CM | POA: Diagnosis not present

## 2023-02-20 ENCOUNTER — Encounter: Payer: Self-pay | Admitting: Family Medicine

## 2023-02-21 LAB — HEMOGLOBIN A1C
Est. average glucose Bld gHb Est-mCnc: 126 mg/dL
Hgb A1c MFr Bld: 6 % — ABNORMAL HIGH (ref 4.8–5.6)

## 2023-02-21 LAB — LIPID PANEL
Chol/HDL Ratio: 4.4 ratio (ref 0.0–4.4)
Cholesterol, Total: 202 mg/dL — ABNORMAL HIGH (ref 100–199)
HDL: 46 mg/dL (ref >39)
LDL Chol Calc (NIH): 138 mg/dL — ABNORMAL HIGH (ref 0–99)
Triglycerides: 99 mg/dL (ref 0–149)
VLDL Cholesterol Cal: 18 mg/dL (ref 5–40)

## 2023-02-21 LAB — CBC WITH DIFFERENTIAL/PLATELET
Basophils Absolute: 0 10*3/uL (ref 0.0–0.2)
Basos: 1 %
EOS (ABSOLUTE): 0.1 10*3/uL (ref 0.0–0.4)
Eos: 1 %
Hematocrit: 41.2 % (ref 34.0–46.6)
Hemoglobin: 13.8 g/dL (ref 11.1–15.9)
Immature Grans (Abs): 0 10*3/uL (ref 0.0–0.1)
Immature Granulocytes: 0 %
Lymphocytes Absolute: 2.4 10*3/uL (ref 0.7–3.1)
Lymphs: 30 %
MCH: 31.1 pg (ref 26.6–33.0)
MCHC: 33.5 g/dL (ref 31.5–35.7)
MCV: 93 fL (ref 79–97)
Monocytes Absolute: 0.6 10*3/uL (ref 0.1–0.9)
Monocytes: 7 %
Neutrophils Absolute: 5 10*3/uL (ref 1.4–7.0)
Neutrophils: 61 %
Platelets: 299 10*3/uL (ref 150–450)
RBC: 4.44 x10E6/uL (ref 3.77–5.28)
RDW: 12.4 % (ref 11.7–15.4)
WBC: 8.1 10*3/uL (ref 3.4–10.8)

## 2023-02-21 LAB — CMP14+EGFR
ALT: 14 [IU]/L (ref 0–32)
AST: 18 [IU]/L (ref 0–40)
Albumin: 4.5 g/dL (ref 3.8–4.9)
Alkaline Phosphatase: 101 [IU]/L (ref 44–121)
BUN/Creatinine Ratio: 35 — ABNORMAL HIGH (ref 12–28)
BUN: 18 mg/dL (ref 8–27)
Bilirubin Total: 0.4 mg/dL (ref 0.0–1.2)
CO2: 20 mmol/L (ref 20–29)
Calcium: 9.3 mg/dL (ref 8.7–10.3)
Chloride: 106 mmol/L (ref 96–106)
Creatinine, Ser: 0.51 mg/dL — ABNORMAL LOW (ref 0.57–1.00)
Globulin, Total: 2.1 g/dL (ref 1.5–4.5)
Glucose: 106 mg/dL — ABNORMAL HIGH (ref 70–99)
Potassium: 4.1 mmol/L (ref 3.5–5.2)
Sodium: 142 mmol/L (ref 134–144)
Total Protein: 6.6 g/dL (ref 6.0–8.5)
eGFR: 107 mL/min/{1.73_m2} (ref >59)

## 2023-02-21 LAB — MICROALBUMIN / CREATININE URINE RATIO
Creatinine, Urine: 94.9 mg/dL
Microalb/Creat Ratio: 8 mg/g{creat} (ref 0–29)
Microalbumin, Urine: 7.2 ug/mL

## 2023-02-21 LAB — VITAMIN D 25 HYDROXY (VIT D DEFICIENCY, FRACTURES): Vit D, 25-Hydroxy: 23.6 ng/mL — ABNORMAL LOW (ref 30.0–100.0)

## 2023-02-21 LAB — TSH+FREE T4
Free T4: 1.07 ng/dL (ref 0.82–1.77)
TSH: 1.41 u[IU]/mL (ref 0.450–4.500)

## 2023-02-21 NOTE — Telephone Encounter (Signed)
Please let patient know Topamax is the one she suppose to take daily  Not Imitrex Imitrex insurance only can cover 9 pills because is not a daily medication its only as needed

## 2023-05-02 ENCOUNTER — Other Ambulatory Visit: Payer: Self-pay | Admitting: Family Medicine

## 2023-05-23 ENCOUNTER — Other Ambulatory Visit: Payer: Self-pay

## 2023-05-23 ENCOUNTER — Ambulatory Visit
Admission: RE | Admit: 2023-05-23 | Discharge: 2023-05-23 | Disposition: A | Discharge status: Home / Self Care | Payer: BC Managed Care – PPO | Source: Ambulatory Visit | Attending: Nurse Practitioner | Admitting: Nurse Practitioner

## 2023-05-23 VITALS — BP 131/85 | HR 79 | Temp 98.0°F | Resp 18

## 2023-05-23 DIAGNOSIS — N898 Other specified noninflammatory disorders of vagina: Secondary | ICD-10-CM

## 2023-05-23 NOTE — ED Triage Notes (Signed)
PT denies any Vag discharge but reports she feels odd in the vag area.

## 2023-05-23 NOTE — ED Provider Notes (Signed)
[facility]    CSN: [id number] Arrival date & time: 05/23/23  0920      History   Chief Complaint Chief Complaint  Patient presents with   Vaginal Itching    Not sure if female infection or maybe bladder infection - Entered by patient    HPI Crystal Villa is a 63 y.o. female.   Patient presents today for a few days of vaginal irritation that is intermittent.  She reports she had similar vaginal irritation a few weeks ago that fully resolved on its own.  She denies vaginal discharge, groin swelling, abdominal pain, nausea/vomiting.  No recent unprotected sexual intercourse.  Patient reports she does use a sexual device and wonders if it may not have been enough.  She also reports she uses a soap with fragrance and did recently change the soap.  She reports intermittent burning on the outside with urination, but no urinary frequency or urgency.  No foul urinary odor or hematuria.    Past Medical History:  Diagnosis Date   Allergy    Arthritis    COPD (chronic obstructive pulmonary disease) (HCC)    GERD (gastroesophageal reflux disease)    Hepatitis C     Patient Active Problem List   Diagnosis Date Noted   History of COPD 02/11/2023   Migraines 02/11/2023   Pelvis fracture (HCC) 04/16/2022   Closed fracture of multiple pubic rami, left, initial encounter (HCC) 04/16/2022   Chronic hepatitis C without hepatic coma (HCC) 09/10/2021    Past Surgical History:  Procedure Laterality Date   ABDOMINAL HYSTERECTOMY     BACK SURGERY     SPINE SURGERY      OB History   No obstetric history on file.      Home Medications    Prior to Admission medications   Medication Sig Start Date End Date Taking? Authorizing Provider  albuterol (VENTOLIN HFA) 108 (90 Base) MCG/ACT inhaler TAKE 2 PUFFS BY MOUTH EVERY 6 HOURS AS NEEDED FOR WHEEZE OR SHORTNESS OF BREATH 05/02/23  Yes Del Newman Nip, Tenna Child, FNP  topiramate (TOPAMAX) 25 MG tablet Take 1 tablet (25 mg total)  by mouth daily. 02/11/23  Yes Del Newman Nip, Rhodes, FNP  umeclidinium-vilanterol (ANORO ELLIPTA) 62.5-25 MCG/ACT AEPB Inhale 1 puff into the lungs once daily. 02/11/23  Yes Del Newman Nip, Tenna Child, FNP  fluticasone (FLONASE) 50 MCG/ACT nasal spray Place 1 spray into both nostrils 2 (two) times daily. Patient not taking: Reported on 02/11/2023 12/17/21   Particia Nearing, PA-C  HYDROcodone-acetaminophen [facility]) 5-325 MG tablet Take 1-2 tablets by mouth every 6 (six) hours as needed for moderate pain. Patient not taking: Reported on 02/11/2023 04/16/22   Persons, [place], PA  ondansetron (ZOFRAN-ODT) 4 MG disintegrating tablet 4mg  ODT q4 hours prn nausea/vomit 01/18/23   Bethann Berkshire, MD  OVER THE COUNTER MEDICATION Goody's powder, one as needed.    [provider]  OVER THE COUNTER MEDICATION Tylenol one capsule as needed.    [provider]  OVER THE COUNTER MEDICATION Excedrine, one tablet as needed.    [provider]  oxyCODONE-acetaminophen (PERCOCET/ROXICET) 5-325 MG tablet Take 1 tablet by mouth every 6 (six) hours as needed for severe pain. Patient not taking: Reported on 02/11/2023 01/18/23   Bethann Berkshire, MD  pantoprazole (PROTONIX) 40 MG tablet TAKE 1 TABLET BY MOUTH EVERY DAY 10/12/21   Janeece Agee, NP  senna-docusate (SENOKOT-S) 8.6-50 MG tablet Take 1 tablet by mouth daily. Patient not taking: Reported on  02/11/2023 03/23/22   Leath-Warren, Sadie Haber, NP  sucralfate (CARAFATE) 1 g tablet Take 1 tablet (1 g total) by mouth 3 (three) times daily as needed. Patient not taking: Reported on 03/26/2022 06/08/21   Particia Nearing, PA-C  SUMAtriptan (IMITREX) 25 MG tablet Take 1 tablet (25 mg total) by mouth every 12 (twelve) hours as needed for migraine. May repeat in 2 hours if headache persists or recurs. 02/11/23   Del Nigel Berthold, FNP  cetirizine (ZYRTEC ALLERGY) 10 MG tablet Take 1 tablet (10 mg total) by mouth daily. 10/02/19  01/14/20  AvegnoZachery Dakins, FNP    Family History Family History  Problem Relation Age of Onset   Healthy Mother    Hyperlipidemia Father    Colon cancer Maternal Grandmother    Esophageal cancer Neg Hx    Stomach cancer Neg Hx    Rectal cancer Neg Hx     Social History Social History   Tobacco Use   Smoking status: Former    Current packs/day: 0.00    Average packs/day: 1 pack/day for 15.0 years (15.0 ttl pk-yrs)    Types: Cigarettes, Cigars    Start date: 04/21/2002    Quit date: 04/21/2017    Years since quitting: 6.0   Smokeless tobacco: Former  Building services engineer status: Never Used  Substance Use Topics   Alcohol use: Never   Drug use: Never     Allergies   Patient has no known allergies.   Review of Systems Review of Systems Per HPI  Physical Exam Triage Vital Signs ED Triage Vitals  Encounter Vitals Group     BP 05/23/23 1048 131/85     Systolic BP Percentile --      Diastolic BP Percentile --      Pulse Rate 05/23/23 1048 79     Resp 05/23/23 1048 18     Temp 05/23/23 1048 98 F (36.7 C)     Temp src --      SpO2 05/23/23 1048 98 %     Weight --      Height --      Head Circumference --      Peak Flow --      Pain Score 05/23/23 1051 0     Pain Loc --      Pain Education --      Exclude from Growth Chart --    No data found.  Updated Vital Signs BP 131/85   Pulse 79   Temp 98 F (36.7 C)   Resp 18   SpO2 98%   Visual Acuity Right Eye Distance:   Left Eye Distance:   Bilateral Distance:    Right Eye Near:   Left Eye Near:    Bilateral Near:     Physical Exam Vitals and nursing note reviewed. Exam conducted with a chaperone present.  Constitutional:      General: She is not in acute distress.    Appearance: Normal appearance. She is not toxic-appearing.  Genitourinary:    Exam position: Lithotomy position.     Pubic Area: No rash.      Labia:        Right: No tenderness or injury.        Left: No tenderness or injury.       Comments: Clayborne Artist, RN as chaperone  Labial dermatitis noted bilaterally; no obvious discharge Lymphadenopathy:     Lower Body: No right inguinal adenopathy. No left inguinal adenopathy.  Skin:    General: Skin is warm and dry.     Coloration: Skin is not jaundiced or pale.     Findings: No erythema.  Neurological:     Mental Status: She is alert and oriented to person, place, and time.     Motor: No weakness.     Gait: Gait normal.  Psychiatric:        Mood and Affect: Mood normal.        Behavior: Behavior is cooperative.      UC Treatments / Results  Labs (all labs ordered are listed, but only abnormal results are displayed) Labs Reviewed  CERVICOVAGINAL ANCILLARY ONLY    EKG   Radiology No results found.  Procedures Procedures (including critical care time)  Medications Ordered in UC Medications - No data to display  Initial Impression / Assessment and Plan / UC Course  I have reviewed the triage vital signs and the nursing notes.  Pertinent labs & imaging results that were available during my care of the patient were reviewed by me and considered in my medical decision making (see chart for details).   Patient is well-appearing, normotensive, afebrile, not tachycardic, not tachypneic, oxygenating well on room air.    1. Vaginal irritation Vaginal cytology is pending-treat as indicated Patient denies recent unprotected sexual intercourse, will defer HIV and syphilis at this time Recommended decreasing use of fragranced soap in the genitalia area Follow-up with OB/GYN if test results negative and/or symptoms do not improve with treatment  The patient was given the opportunity to ask questions.  All questions answered to their satisfaction.  The patient is in agreement to this plan.    Final Clinical Impressions(s) / UC Diagnoses   Final diagnoses:  Vaginal irritation     Discharge Instructions      We will contact you if the results come  back abnormal from the vaginal testing today.  Recommend avoidance of soaps in the area below the belt to help prevent irritation.  Follow-up with OB/GYN if symptoms do not improve with treatment and/or testing is negative.    ED Prescriptions   None    PDMP not reviewed this encounter.   Valentino Nose, NP 05/23/23 1204

## 2023-05-23 NOTE — Discharge Instructions (Addendum)
We will contact you if the results come back abnormal from the vaginal testing today.  Recommend avoidance of soaps in the area below the belt to help prevent irritation.  Follow-up with OB/GYN if symptoms do not improve with treatment and/or testing is negative.

## 2023-05-24 LAB — CERVICOVAGINAL ANCILLARY ONLY
Bacterial Vaginitis (gardnerella): NEGATIVE
Candida Glabrata: NEGATIVE
Candida Vaginitis: NEGATIVE
Chlamydia: NEGATIVE
Comment: NEGATIVE
Comment: NEGATIVE
Comment: NEGATIVE
Comment: NEGATIVE
Comment: NEGATIVE
Comment: NORMAL
Neisseria Gonorrhea: NEGATIVE
Trichomonas: NEGATIVE

## 2023-05-27 ENCOUNTER — Ambulatory Visit
Admission: EM | Admit: 2023-05-27 | Discharge: 2023-05-27 | Disposition: A | Discharge status: Home / Self Care | Payer: BC Managed Care – PPO | Attending: Family Medicine | Admitting: Family Medicine

## 2023-05-27 DIAGNOSIS — J069 Acute upper respiratory infection, unspecified: Secondary | ICD-10-CM

## 2023-05-27 DIAGNOSIS — Z20828 Contact with and (suspected) exposure to other viral communicable diseases: Secondary | ICD-10-CM

## 2023-05-27 LAB — POC COVID19/FLU A&B COMBO
Covid Antigen, POC: NEGATIVE
Influenza A Antigen, POC: NEGATIVE
Influenza B Antigen, POC: NEGATIVE

## 2023-05-27 MED ORDER — PROMETHAZINE-DM 6.25-15 MG/5ML PO SYRP: 5.0000 mL | ORAL_SOLUTION | Freq: Four times a day (QID) | ORAL | 0 refills | Status: DC | PRN

## 2023-05-27 MED ORDER — OSELTAMIVIR PHOSPHATE 75 MG PO CAPS: 75.0000 mg | ORAL_CAPSULE | Freq: Two times a day (BID) | ORAL | 0 refills | Status: DC

## 2023-05-27 MED ORDER — FLUTICASONE PROPIONATE 50 MCG/ACT NA SUSP: 1.0000 | Freq: Two times a day (BID) | NASAL | 2 refills | Status: DC

## 2023-05-27 NOTE — ED Provider Notes (Signed)
 [facility]    CSN: [id number] Arrival date & time: 05/27/23  1216      History   Chief Complaint Chief Complaint  Patient presents with   Cough    HPI Crystal Villa is a 63 y.o. female.   Patient presenting today with 1 day history of nasal congestion, sore throat, cough, body aches, chills.  Denies fever, chest pain, shortness of breath, abdominal pain, nausea vomiting or diarrhea.  So far trying Tylenol , Coricidin, Mucinex with minimal relief.  Her mother who lives with her just tested positive for influenza A.    Past Medical History:  Diagnosis Date   Allergy    Arthritis    COPD (chronic obstructive pulmonary disease) (HCC)    GERD (gastroesophageal reflux disease)    Hepatitis C     Patient Active Problem List   Diagnosis Date Noted   History of COPD 02/11/2023   Migraines 02/11/2023   Pelvis fracture (HCC) 04/16/2022   Closed fracture of multiple pubic rami, left, initial encounter (HCC) 04/16/2022   Chronic hepatitis C without hepatic coma (HCC) 09/10/2021    Past Surgical History:  Procedure Laterality Date   ABDOMINAL HYSTERECTOMY     BACK SURGERY     SPINE SURGERY      OB History   No obstetric history on file.      Home Medications    Prior to Admission medications   Medication Sig Start Date End Date Taking? Authorizing Provider  fluticasone  (FLONASE ) 50 MCG/ACT nasal spray Place 1 spray into both nostrils 2 (two) times daily. 05/27/23  Yes Stuart Vernell Almarie, PA-C  oseltamivir  (TAMIFLU ) 75 MG capsule Take 1 capsule (75 mg total) by mouth every 12 (twelve) hours. 05/27/23  Yes Stuart Vernell Almarie, PA-C  promethazine -dextromethorphan (PROMETHAZINE -DM) 6.25-15 MG/5ML syrup Take 5 mLs by mouth 4 (four) times daily as needed. 05/27/23  Yes Stuart Vernell Almarie, PA-C  albuterol  (VENTOLIN  HFA) 108 (90 Base) MCG/ACT inhaler TAKE 2 PUFFS BY MOUTH EVERY 6 HOURS AS NEEDED FOR WHEEZE OR SHORTNESS OF BREATH 05/02/23   Del Wilhelmena Falter,  Iliana, FNP  fluticasone  (FLONASE ) 50 MCG/ACT nasal spray Place 1 spray into both nostrils 2 (two) times daily. Patient not taking: Reported on 02/11/2023 12/17/21   Stuart Vernell Almarie, PA-C  HYDROcodone -acetaminophen  (NORCO) 5-325 MG tablet Take 1-2 tablets by mouth every 6 (six) hours as needed for moderate pain. Patient not taking: Reported on 02/11/2023 04/16/22   Persons, Ronal Dragon, [place]  ondansetron  (ZOFRAN -ODT) 4 MG disintegrating tablet 4mg  ODT q4 hours prn nausea/vomit 01/18/23   Zammit, Joseph, MD  OVER THE COUNTER MEDICATION Goody's powder, one as needed.    [provider]  OVER THE COUNTER MEDICATION Tylenol  one capsule as needed.    [provider]  OVER THE COUNTER MEDICATION Excedrine, one tablet as needed.    [provider]  oxyCODONE -acetaminophen  (PERCOCET/ROXICET) 5-325 MG tablet Take 1 tablet by mouth every 6 (six) hours as needed for severe pain. Patient not taking: Reported on 02/11/2023 01/18/23   Suzette Pac, MD  pantoprazole  (PROTONIX ) 40 MG tablet TAKE 1 TABLET BY MOUTH EVERY DAY 10/12/21   Kip Ade, NP  senna-docusate (SENOKOT-S) 8.6-50 MG tablet Take 1 tablet by mouth daily. Patient not taking: Reported on 02/11/2023 03/23/22   Leath-Warren, Etta PARAS, NP  sucralfate  (CARAFATE ) 1 g tablet Take 1 tablet (1 g total) by mouth 3 (three) times daily as needed. Patient not taking: Reported on 03/26/2022 06/08/21   Stuart Vernell Almarie, PA-C  SUMAtriptan  (IMITREX ) 25 MG tablet Take 1 tablet (25 mg total) by mouth every 12 (twelve) hours as needed for migraine. May repeat in 2 hours if headache persists or recurs. 02/11/23   Del Orbe Polanco, Iliana, FNP  topiramate  (TOPAMAX ) 25 MG tablet Take 1 tablet (25 mg total) by mouth daily. 02/11/23   Del Orbe Polanco, Iliana, FNP  umeclidinium-vilanterol (ANORO ELLIPTA ) 62.5-25 MCG/ACT AEPB Inhale 1 puff into the lungs once daily. 02/11/23   Del Orbe Polanco, Iliana, FNP  cetirizine  (ZYRTEC  ALLERGY)  10 MG tablet Take 1 tablet (10 mg total) by mouth daily. 10/02/19 01/14/20  Avegno, Komlanvi S, FNP    Family History Family History  Problem Relation Age of Onset   Healthy Mother    Hyperlipidemia Father    Colon cancer Maternal Grandmother    Esophageal cancer Neg Hx    Stomach cancer Neg Hx    Rectal cancer Neg Hx     Social History Social History   Tobacco Use   Smoking status: Former    Current packs/day: 0.00    Average packs/day: 1 pack/day for 15.0 years (15.0 ttl pk-yrs)    Types: Cigarettes, Cigars    Start date: 04/21/2002    Quit date: 04/21/2017    Years since quitting: 6.1   Smokeless tobacco: Former  Building Services Engineer status: Never Used  Substance Use Topics   Alcohol use: Never   Drug use: Never     Allergies   Patient has no known allergies.   Review of Systems Review of Systems Per HPI  Physical Exam Triage Vital Signs ED Triage Vitals [05/27/23 1343]  Encounter Vitals Group     BP 138/89     Systolic BP Percentile      Diastolic BP Percentile      Pulse Rate 96     Resp 16     Temp 98.1 F (36.7 C)     Temp Source Oral     SpO2 95 %     Weight      Height      Head Circumference      Peak Flow      Pain Score 0     Pain Loc      Pain Education      Exclude from Growth Chart    No data found.  Updated Vital Signs BP 138/89 (BP Location: Right Arm)   Pulse 96   Temp 98.1 F (36.7 C) (Oral)   Resp 16   SpO2 95%   Visual Acuity Right Eye Distance:   Left Eye Distance:   Bilateral Distance:    Right Eye Near:   Left Eye Near:    Bilateral Near:     Physical Exam Vitals and nursing note reviewed.  Constitutional:      Appearance: Normal appearance.  HENT:     Head: Atraumatic.     Right Ear: Tympanic membrane and external ear normal.     Left Ear: Tympanic membrane and external ear normal.     Nose: Rhinorrhea present.     Mouth/Throat:     Mouth: Mucous membranes are moist.     Pharynx: Posterior oropharyngeal  erythema present.  Eyes:     Extraocular Movements: Extraocular movements intact.     Conjunctiva/sclera: Conjunctivae normal.  Cardiovascular:     Rate and Rhythm: Normal rate and regular rhythm.     Heart sounds: Normal heart sounds.  Pulmonary:     Effort: Pulmonary effort  is normal.     Breath sounds: Normal breath sounds. No wheezing or rales.  Musculoskeletal:        General: Normal range of motion.     Cervical back: Normal range of motion and neck supple.  Skin:    General: Skin is warm and dry.  Neurological:     Mental Status: She is alert and oriented to person, place, and time.  Psychiatric:        Mood and Affect: Mood normal.        Thought Content: Thought content normal.      UC Treatments / Results  Labs (all labs ordered are listed, but only abnormal results are displayed) Labs Reviewed  POC COVID19/FLU A&B COMBO    EKG   Radiology No results found.  Procedures Procedures (including critical care time)  Medications Ordered in UC Medications - No data to display  Initial Impression / Assessment and Plan / UC Course  I have reviewed the triage vital signs and the nursing notes.  Pertinent labs & imaging results that were available during my care of the patient were reviewed by me and considered in my medical decision making (see chart for details).     Vitals and exam reassuring today.  Rapid flu and COVID-negative but given home exposure to influenza and developing consistent symptoms we will start Tamiflu  in addition to Phenergan  DM and Flonase  for symptoms.  Discussed supportive over-the-counter medications, home care and return precautions.  Final Clinical Impressions(s) / UC Diagnoses   Final diagnoses:  Viral URI with cough  Exposure to influenza   Discharge Instructions   None    ED Prescriptions     Medication Sig Dispense Auth. Provider   oseltamivir  (TAMIFLU ) 75 MG capsule Take 1 capsule (75 mg total) by mouth every 12  (twelve) hours. 10 capsule Stuart Vernell Norris, PA-C   promethazine -dextromethorphan (PROMETHAZINE -DM) 6.25-15 MG/5ML syrup Take 5 mLs by mouth 4 (four) times daily as needed. 100 mL Stuart Vernell Norris, PA-C   fluticasone  (FLONASE ) 50 MCG/ACT nasal spray Place 1 spray into both nostrils 2 (two) times daily. 16 g Stuart Vernell Norris, [place]      PDMP not reviewed this encounter.   Destani, [place], [place] 05/27/23 1526

## 2023-05-27 NOTE — ED Triage Notes (Signed)
 Pt states cough,stuffy nose and scratchy throat since last night.

## 2023-06-16 NOTE — Progress Notes (Unsigned)
 NEUROLOGY CONSULTATION NOTE  Crystal Villa MRN: [id number] DOB: 1960/08/01  Referring provider: Bethann Berkshire, MD (ED referral) Primary care provider: Rica Records, FNP  Reason for consult:  migraine  Assessment/Plan:   Chronic migraine with and without aura, without status migrainosus, not intractable Ocular migraines   Migraine prevention:  Start nortriptyline 10mg  at bedtime.  Increase to 25mg  at bedtime in 4 weeks if needed. Migraine rescue:  rizatriptan 5mg  an Zofran-ODT 4mg .  Discontinue Goodys Lifestyle modification:  discussed rebound headache, hydration, diet, exercise Keep headache diary Follow up 6 months.    Subjective:  Crystal Villa is a 63  year old right-handed female with COPD and history of hepatitis C who presents for migraines.  History supplemented by PCP and ED notes.  Migraine with and without aura: Onset:  30s, worse over the past year. Always has a persistent dull headache. Location:  Varies:  frontal, occipital, retro-orbital, diffuse Quality:  aching, pressure Intensity:  usually 4/10 but can be up to 10/10.  She denies new headache, thunderclap headache  Aura:  kaleidoscope (not always).  Prodrome:  absent Postdrome:  absent Associated symptoms:  Nausea, phonophobia, blurred vision, sometimes neck pain.  She denies associated vomiting, photophobia, unilateral numbness or weakness. Duration:  all day  Ocular migraine Also has ocular migraines with the kaleidoscope vision without headache. Duration:  1 hour Frequency:  3 to 5 times a month  Frequency:  daily(severe 10 days a month) Frequency of abortive medication: Goodys  daily for many years Triggers:  unknown Relieving factors:  Goodys, resting Activity:  aggravates when severe  Always has trouble seeing distance.  Had to have prescription changed twice over the past year but does not improve for very long.    Seen in the ED on 9/30 and 01/18/2023.  MRI of brain without  contrast personally reviewed showed few nonspecific T2 foci within the cerebral white matter but no acute intracranial abnormalities.    Past NSAIDS/analgesics:  Percocet, Toradol, ibuprofen, naproxen       Past abortive triptans:  sumatriptan 25mg  (chest pain) Past abortive ergotamine:  none Past muscle relaxants:  none Past anti-emetic:  Reglan inj, Compazine inj Past antihypertensive medications:  none Past antidepressant medications:  Prozac Past anticonvulsant medications:  topiramate (did not feel well, affected vision) Past anti-CGRP:  none Past vitamins/Herbal/Supplements:  none Past antihistamines/decongestants:  Zyrtec, Benadryl Other past therapies:  none  Current NSAIDS/analgesics:  Goodys, Tylenol Current triptans:  none Current ergotamine:  none Current anti-emetic:  Zofran-ODT 4mg  Current muscle relaxants:  none Current Antihypertensive medications:  none Current Antidepressant medications:  none Current Anticonvulsant medications:  none Current anti-CGRP:  none Current Vitamins/Herbal/Supplements:  none Current Antihistamines/Decongestants:  Flonase Other therapy:  none   Caffeine:  Diet Pepsi daily.  Rarely coffee Diet:  Drinks sugar-free flavored water.   Exercise:  no Depression:  no; Anxiety:  work-related stress Sleep hygiene:  gets up 2-3 times a night to urinate.  Goes to bed 7 PM.  Gets up at 3 AM for work.   Family history of headache:  mom (migraines), sister (migraines)      PAST MEDICAL HISTORY: Past Medical History:  Diagnosis Date   Allergy    Arthritis    COPD (chronic obstructive pulmonary disease) (HCC)    GERD (gastroesophageal reflux disease)    Hepatitis C     PAST SURGICAL HISTORY: Past Surgical History:  Procedure Laterality Date   ABDOMINAL HYSTERECTOMY     BACK SURGERY  SPINE SURGERY      MEDICATIONS: Current Outpatient Medications on File Prior to Visit  Medication Sig Dispense Refill   albuterol (VENTOLIN HFA)  108 (90 Base) MCG/ACT inhaler TAKE 2 PUFFS BY MOUTH EVERY 6 HOURS AS NEEDED FOR WHEEZE OR SHORTNESS OF BREATH 8.5 each 2   fluticasone (FLONASE) 50 MCG/ACT nasal spray Place 1 spray into both nostrils 2 (two) times daily. (Patient not taking: Reported on 02/11/2023) 16 g 2   fluticasone (FLONASE) 50 MCG/ACT nasal spray Place 1 spray into both nostrils 2 (two) times daily. 16 g 2   HYDROcodone-acetaminophen (NORCO) 5-325 MG tablet Take 1-2 tablets by mouth every 6 (six) hours as needed for moderate pain. (Patient not taking: Reported on 02/11/2023) 20 tablet 0   ondansetron (ZOFRAN-ODT) 4 MG disintegrating tablet 4mg  ODT q4 hours prn nausea/vomit 10 tablet 0   oseltamivir (TAMIFLU) 75 MG capsule Take 1 capsule (75 mg total) by mouth every 12 (twelve) hours. 10 capsule 0   OVER THE COUNTER MEDICATION Goody's powder, one as needed.     OVER THE COUNTER MEDICATION Tylenol one capsule as needed.     OVER THE COUNTER MEDICATION Excedrine, one tablet as needed.     oxyCODONE-acetaminophen (PERCOCET/ROXICET) 5-325 MG tablet Take 1 tablet by mouth every 6 (six) hours as needed for severe pain. (Patient not taking: Reported on 02/11/2023) 10 tablet 0   pantoprazole (PROTONIX) 40 MG tablet TAKE 1 TABLET BY MOUTH EVERY DAY 90 tablet 1   promethazine-dextromethorphan (PROMETHAZINE-DM) 6.25-15 MG/5ML syrup Take 5 mLs by mouth 4 (four) times daily as needed. 100 mL 0   senna-docusate (SENOKOT-S) 8.6-50 MG tablet Take 1 tablet by mouth daily. (Patient not taking: Reported on 02/11/2023) 30 tablet 0   sucralfate (CARAFATE) 1 g tablet Take 1 tablet (1 g total) by mouth 3 (three) times daily as needed. (Patient not taking: Reported on 03/26/2022) 90 tablet 1   SUMAtriptan (IMITREX) 25 MG tablet Take 1 tablet (25 mg total) by mouth every 12 (twelve) hours as needed for migraine. May repeat in 2 hours if headache persists or recurs. 10 tablet 1   topiramate (TOPAMAX) 25 MG tablet Take 1 tablet (25 mg total) by mouth daily.  30 tablet 2   umeclidinium-vilanterol (ANORO ELLIPTA) 62.5-25 MCG/ACT AEPB Inhale 1 puff into the lungs once daily. 60 each 2   [DISCONTINUED] cetirizine (ZYRTEC ALLERGY) 10 MG tablet Take 1 tablet (10 mg total) by mouth daily. 30 tablet 0   No current facility-administered medications on file prior to visit.    ALLERGIES: No Known Allergies  FAMILY HISTORY: Family History  Problem Relation Age of Onset   Healthy Mother    Hyperlipidemia Father    Colon cancer Maternal Grandmother    Esophageal cancer Neg Hx    Stomach cancer Neg Hx    Rectal cancer Neg Hx     Objective:  Blood pressure (!) 149/78, pulse 83, height 5\' 4"  (1.626 m), weight 172 lb (78 kg), SpO2 94%. General: No acute distress.  Patient appears well-groomed.   Head:  Normocephalic/atraumatic Eyes:  fundi examined but not visualized Neck: supple, no paraspinal tenderness, full range of motion Heart: regular rate and rhythm Vascular: No carotid bruits. Neurological Exam: Mental status: alert and oriented to person, place, and time, speech fluent and not dysarthric, language intact. Cranial nerves: CN I: not tested CN II: pupils equal, round and reactive to light, visual fields intact CN III, IV, VI:  full range of motion, no nystagmus, no  ptosis CN V: facial sensation intact. CN VII: upper and lower face symmetric CN VIII: hearing intact CN IX, X: gag intact, uvula midline CN XI: sternocleidomastoid and trapezius muscles intact CN XII: tongue midline Bulk & Tone: normal, no fasciculations. Motor:  muscle strength 5/5 throughout Sensation:  Pinprick and vibratory sensation intact. Deep Tendon Reflexes:  2+ throughout,  toes downgoing.   Finger to nose testing:  Without dysmetria.   Gait:  Normal station and stride.  Romberg negative.    Thank you for allowing me to take part in the care of this patient.  Shon Millet, DO  CC: Rica Records, FNP

## 2023-06-17 ENCOUNTER — Encounter: Payer: Self-pay | Admitting: Neurology

## 2023-06-17 ENCOUNTER — Ambulatory Visit: Payer: BC Managed Care – PPO | Admitting: Neurology

## 2023-06-17 VITALS — BP 149/78 | HR 83 | Ht 64.0 in | Wt 172.0 lb

## 2023-06-17 DIAGNOSIS — G43709 Chronic migraine without aura, not intractable, without status migrainosus: Secondary | ICD-10-CM

## 2023-06-17 DIAGNOSIS — G43109 Migraine with aura, not intractable, without status migrainosus: Secondary | ICD-10-CM | POA: Diagnosis not present

## 2023-06-17 MED ORDER — NORTRIPTYLINE HCL 10 MG PO CAPS: 10.0000 mg | ORAL_CAPSULE | Freq: Every day | ORAL | 5 refills | Status: DC

## 2023-06-17 MED ORDER — RIZATRIPTAN BENZOATE 5 MG PO TABS: 5.0000 mg | ORAL_TABLET | ORAL | 5 refills | Status: AC | PRN

## 2023-06-17 MED ORDER — ONDANSETRON 4 MG PO TBDP: 4.0000 mg | ORAL_TABLET | Freq: Three times a day (TID) | ORAL | 5 refills | Status: AC | PRN

## 2023-06-17 NOTE — Patient Instructions (Signed)
  Start nortriptyline 10mg  at bedtime.  Contact us in 4 weeks with update and we can increase dose if needed. STOP GOODYS.  Take rizatriptan at earliest onset of headache.  May repeat dose once in 2 hours if needed.  Maximum 2 tablets in 24 hours.  Limit use to no more than 2 days out of the week to prevent rebound. Take ondansetron for nausea Be aware of common food triggers: Routine exercise Stay adequately hydrated (aim for 64 oz water daily) Keep headache diary Maintain proper stress management Maintain proper sleep hygiene Do not skip meals Consider supplements:  magnesium citrate 400mg  daily, riboflavin 400mg  daily, coenzyme Q10 300mg  daily

## 2023-07-08 DIAGNOSIS — H52223 Regular astigmatism, bilateral: Secondary | ICD-10-CM | POA: Diagnosis not present

## 2023-07-08 DIAGNOSIS — H5203 Hypermetropia, bilateral: Secondary | ICD-10-CM | POA: Diagnosis not present

## 2023-07-08 DIAGNOSIS — H2513 Age-related nuclear cataract, bilateral: Secondary | ICD-10-CM | POA: Diagnosis not present

## 2023-07-09 ENCOUNTER — Other Ambulatory Visit: Payer: Self-pay | Admitting: Neurology

## 2023-07-15 DIAGNOSIS — H2513 Age-related nuclear cataract, bilateral: Secondary | ICD-10-CM | POA: Diagnosis not present

## 2023-07-15 DIAGNOSIS — H5051 Esophoria: Secondary | ICD-10-CM | POA: Diagnosis not present

## 2023-08-12 DIAGNOSIS — H5051 Esophoria: Secondary | ICD-10-CM | POA: Diagnosis not present

## 2023-08-23 ENCOUNTER — Other Ambulatory Visit: Payer: Self-pay | Admitting: Family Medicine

## 2024-01-10 ENCOUNTER — Ambulatory Visit
Admission: RE | Admit: 2024-01-10 | Discharge: 2024-01-10 | Disposition: A | Discharge status: Home / Self Care | Source: Ambulatory Visit | Attending: Nurse Practitioner | Admitting: Nurse Practitioner

## 2024-01-10 VITALS — BP 153/91 | HR 87 | Temp 97.8°F | Resp 18

## 2024-01-10 DIAGNOSIS — J069 Acute upper respiratory infection, unspecified: Secondary | ICD-10-CM | POA: Diagnosis not present

## 2024-01-10 LAB — POC COVID19/FLU A&B COMBO
Covid Antigen, POC: NEGATIVE
Influenza A Antigen, POC: NEGATIVE
Influenza B Antigen, POC: NEGATIVE

## 2024-01-10 LAB — POCT RAPID STREP A (OFFICE): Rapid Strep A Screen: NEGATIVE

## 2024-01-10 MED ORDER — FLUTICASONE PROPIONATE 50 MCG/ACT NA SUSP: 2.0000 | Freq: Every day | NASAL | 0 refills | Status: AC

## 2024-01-10 MED ORDER — PROMETHAZINE-DM 6.25-15 MG/5ML PO SYRP: 5.0000 mL | ORAL_SOLUTION | Freq: Four times a day (QID) | ORAL | 0 refills | Status: AC | PRN

## 2024-01-10 NOTE — Discharge Instructions (Signed)
 The COVID/flu test and rapid strep test were negative. Take medication as prescribed.  Please be advised that the cough medicine prescribed today may cause drowsiness.  Do not drive, operate heavy equipment, or drink alcohol while taking the medication. You may take over-the-counter Tylenol  as needed for pain or discomfort. Recommend the use of normal saline nasal spray throughout the day for nasal congestion and runny nose. For your cough, you may find it helpful to use a humidifier in your bedroom at nighttime during sleep and to sleep elevated on pillows while symptoms persist. Symptoms should begin to improve over the next 5 to 7 days.  If symptoms fail to improve, or begin to worsen, you may follow-up in this clinic or with your primary care physician for further evaluation. Follow-up as needed.

## 2024-01-10 NOTE — ED Provider Notes (Signed)
 [facility]    CSN: [id number] Arrival date & time: 01/10/24  1017      History   Chief Complaint Chief Complaint  Patient presents with   Sore Throat    Some coughing, nose stopped up but not running , feel real bad - Entered by patient    HPI Crystal Villa is a 63 y.o. female.   The history is provided by the patient.   Patient presents with a 2-day history of cough, nasal congestion, nausea, vomiting, body aches, and sore throat.  She denies fever, chills, headache, ear pain, wheezing, difficulty breathing, chest pain, abdominal pain, nausea, vomiting, diarrhea, or rash.  Patient denies any obvious close sick contacts.  So far, states she has taken over-the-counter medications for her symptoms.  Past Medical History:  Diagnosis Date   Allergy    Arthritis    COPD (chronic obstructive pulmonary disease) (HCC)    GERD (gastroesophageal reflux disease)    Hepatitis C     Patient Active Problem List   Diagnosis Date Noted   History of COPD 02/11/2023   Migraines 02/11/2023   Pelvis fracture (HCC) 04/16/2022   Closed fracture of multiple pubic rami, left, initial encounter (HCC) 04/16/2022   Chronic hepatitis C without hepatic coma (HCC) 09/10/2021    Past Surgical History:  Procedure Laterality Date   ABDOMINAL HYSTERECTOMY     BACK SURGERY     SPINE SURGERY      OB History   No obstetric history on file.      Home Medications    Prior to Admission medications   Medication Sig Start Date End Date Taking? Authorizing Provider  albuterol  (VENTOLIN  HFA) 108 (90 Base) MCG/ACT inhaler TAKE 2 PUFFS BY MOUTH EVERY 6 HOURS AS NEEDED FOR WHEEZE OR SHORTNESS OF BREATH 05/02/23   Del Orbe Polanco, Iliana, FNP  fluticasone  (FLONASE ) 50 MCG/ACT nasal spray Place 1 spray into both nostrils 2 (two) times daily. 12/17/21   Stuart Vernell Almarie, PA-C  fluticasone  (FLONASE ) 50 MCG/ACT nasal spray Place 1 spray into both nostrils 2 (two) times daily. 05/27/23    Stuart Vernell Almarie, PA-C  nortriptyline  (PAMELOR ) 10 MG capsule TAKE 1 CAPSULE BY MOUTH AT BEDTIME. 07/11/23   Skeet, Adam R, DO  ondansetron  (ZOFRAN -ODT) 4 MG disintegrating tablet Take 1 tablet (4 mg total) by mouth every 8 (eight) hours as needed. 06/17/23   Skeet, Adam R, DO  OVER THE COUNTER MEDICATION Goody's powder, one as needed.    [provider]  OVER THE COUNTER MEDICATION Tylenol  one capsule as needed.    [provider]  OVER THE COUNTER MEDICATION Excedrine, one tablet as needed.    [provider]  pantoprazole  (PROTONIX ) 40 MG tablet TAKE 1 TABLET BY MOUTH EVERY DAY 10/12/21   Kip Ade, NP  rizatriptan  (MAXALT ) 5 MG tablet Take 1 tablet (5 mg total) by mouth as needed for migraine. May repeat in 2 hours if needed.  Maximum 2 tablets in 24 hours. 06/17/23   Skeet, Adam R, DO  senna-docusate (SENOKOT-S) 8.6-50 MG tablet Take 1 tablet by mouth daily. 03/23/22   Leath-Warren, Etta PARAS, NP  SUMAtriptan  (IMITREX ) 25 MG tablet Take 1 tablet (25 mg total) by mouth every 12 (twelve) hours as needed for migraine. May repeat in 2 hours if headache persists or recurs. Patient not taking: Reported on 06/17/2023 02/11/23   Del Orbe Polanco, Iliana, FNP  topiramate  (TOPAMAX ) 25 MG tablet Take 1 tablet (25 mg total) by mouth  daily. Patient not taking: Reported on 06/17/2023 02/11/23   Del Orbe Polanco, Iliana, FNP  umeclidinium-vilanterol (ANORO ELLIPTA ) 62.5-25 MCG/ACT AEPB Inhale 1 puff into the lungs once daily. 02/11/23   Del Wilhelmena Lloyd Sola, FNP  cetirizine  (ZYRTEC  ALLERGY) 10 MG tablet Take 1 tablet (10 mg total) by mouth daily. 10/02/19 01/14/20  Avegno, Komlanvi S, FNP    Family History Family History  Problem Relation Age of Onset   Migraines Mother    Healthy Mother    Hyperlipidemia Father    Migraines Sister    Colon cancer Maternal Grandmother    Esophageal cancer Neg Hx    Stomach cancer Neg Hx    Rectal cancer Neg Hx     Social  History Social History   Tobacco Use   Smoking status: Former    Current packs/day: 0.00    Average packs/day: 1 pack/day for 15.0 years (15.0 ttl pk-yrs)    Types: Cigarettes, Cigars    Start date: 04/21/2002    Quit date: 04/21/2017    Years since quitting: 6.7   Smokeless tobacco: Former  Building services engineer status: Never Used  Substance Use Topics   Alcohol use: Never   Drug use: Never     Allergies   Patient has no known allergies.   Review of Systems Review of Systems Per HPI  Physical Exam Triage Vital Signs ED Triage Vitals  Encounter Vitals Group     BP 01/10/24 1024 (!) 153/91     Girls Systolic BP Percentile --      Girls Diastolic BP Percentile --      Boys Systolic BP Percentile --      Boys Diastolic BP Percentile --      Pulse Rate 01/10/24 1024 87     Resp 01/10/24 1024 18     Temp 01/10/24 1024 97.8 F (36.6 C)     Temp Source 01/10/24 1024 Oral     SpO2 01/10/24 1024 96 %     Weight --      Height --      Head Circumference --      Peak Flow --      Pain Score 01/10/24 1025 5     Pain Loc --      Pain Education --      Exclude from Growth Chart --    No data found.  Updated Vital Signs BP (!) 153/91 (BP Location: Right Arm)   Pulse 87   Temp 97.8 F (36.6 C) (Oral)   Resp 18   SpO2 96%   Visual Acuity Right Eye Distance:   Left Eye Distance:   Bilateral Distance:    Right Eye Near:   Left Eye Near:    Bilateral Near:     Physical Exam Vitals and nursing note reviewed.  Constitutional:      General: She is not in acute distress.    Appearance: Normal appearance.  HENT:     Head: Normocephalic.     Right Ear: Tympanic membrane, ear canal and external ear normal.     Left Ear: Tympanic membrane, ear canal and external ear normal.     Nose: Congestion present.     Right Turbinates: Enlarged and swollen.     Left Turbinates: Enlarged and swollen.     Right Sinus: No maxillary sinus tenderness or frontal sinus tenderness.      Left Sinus: No maxillary sinus tenderness or frontal sinus tenderness.     Mouth/Throat:  Lips: Pink.     Mouth: Mucous membranes are moist.     Pharynx: Posterior oropharyngeal erythema and postnasal drip present. No pharyngeal swelling, oropharyngeal exudate or uvula swelling.     Comments: Cobblestoning present to posterior oropharynx  Eyes:     Extraocular Movements: Extraocular movements intact.     Conjunctiva/sclera: Conjunctivae normal.     Pupils: Pupils are equal, round, and reactive to light.  Cardiovascular:     Rate and Rhythm: Normal rate and regular rhythm.     Pulses: Normal pulses.     Heart sounds: Normal heart sounds.  Pulmonary:     Effort: Pulmonary effort is normal. No respiratory distress.     Breath sounds: Normal breath sounds. No stridor. No wheezing, rhonchi or rales.  Abdominal:     General: Bowel sounds are normal.     Palpations: Abdomen is soft.     Tenderness: There is no abdominal tenderness.  Musculoskeletal:     Cervical back: Normal range of motion.  Lymphadenopathy:     Cervical: No cervical adenopathy.  Skin:    General: Skin is warm and dry.  Neurological:     General: No focal deficit present.     Mental Status: She is alert and oriented to person, place, and time.  Psychiatric:        Mood and Affect: Mood normal.        Behavior: Behavior normal.      UC Treatments / Results  Labs (all labs ordered are listed, but only abnormal results are displayed) Labs Reviewed  POC COVID19/FLU A&B COMBO  POCT RAPID STREP A (OFFICE)    EKG   Radiology No results found.  Procedures Procedures (including critical care time)  Medications Ordered in UC Medications - No data to display  Initial Impression / Assessment and Plan / UC Course  I have reviewed the triage vital signs and the nursing notes.  Pertinent labs & imaging results that were available during my care of the patient were reviewed by me and considered in my medical  decision making (see chart for details).  The rapid strep test and COVID/flu test were negative.  On exam, the patient's lung sounds are clear throughout, room air sats at 96%.  She is well-appearing, is in no acute distress, and vital signs are stable, although she is mildly hypertensive.  Symptoms consistent with viral URI with cough.  Will provide symptomatic treatment with Promethazine  DM for the cough, and fluticasone  50 micro nasal spray for nasal congestion and runny nose.  Supportive care recommendations were provided and discussed with the patient to include fluids, rest, over-the-counter analgesics, warm salt water gargles, and use of a humidifier during sleep.  Discussed indications with patient regarding follow-up.  Patient was in agreement with this plan of care and verbalizes understanding.  All questions were answered.  Patient stable for discharge.   Final Clinical Impressions(s) / UC Diagnoses   Final diagnoses:  None   Discharge Instructions   None    ED Prescriptions   None    PDMP not reviewed this encounter.   Gilmer Etta PARAS, NP 01/10/24 1049

## 2024-01-10 NOTE — ED Triage Notes (Signed)
 Pt reports she has a cough, nasal congestion, n/vbodyaches, and a sore throat x 2 days

## 2024-02-15 ENCOUNTER — Other Ambulatory Visit: Payer: Self-pay | Admitting: Neurology

## 2024-03-22 ENCOUNTER — Ambulatory Visit
Admission: RE | Admit: 2024-03-22 | Discharge: 2024-03-22 | Disposition: A | Discharge status: Home / Self Care | Attending: Family Medicine | Admitting: Family Medicine

## 2024-03-22 VITALS — BP 135/90 | HR 89 | Temp 98.6°F | Resp 20

## 2024-03-22 DIAGNOSIS — J441 Chronic obstructive pulmonary disease with (acute) exacerbation: Secondary | ICD-10-CM

## 2024-03-22 DIAGNOSIS — J069 Acute upper respiratory infection, unspecified: Secondary | ICD-10-CM

## 2024-03-22 MED ORDER — PROMETHAZINE-DM 6.25-15 MG/5ML PO SYRP: 5.0000 mL | ORAL_SOLUTION | Freq: Four times a day (QID) | ORAL | 0 refills | Status: AC | PRN

## 2024-03-22 MED ORDER — AZELASTINE HCL 0.1 % NA SOLN: 1.0000 | Freq: Two times a day (BID) | NASAL | 0 refills | Status: AC

## 2024-03-22 MED ORDER — PREDNISONE 20 MG PO TABS: 40.0000 mg | ORAL_TABLET | Freq: Every day | ORAL | 0 refills | Status: AC

## 2024-03-22 NOTE — ED Provider Notes (Signed)
 [facility]    CSN: [id number] Arrival date & time: 03/22/24  1120      History   Chief Complaint Chief Complaint  Patient presents with   Cough    Conjestion ears hurt - Entered by patient    HPI Crystal Villa is a 63 y.o. female.   Patient today with 4-day history of cough, congestion, headache, ear pain, chest tightness, wheezing.  Denies fever, chest pain, shortness of breath, abdominal pain, vomiting, diarrhea.  So far trying her home inhalers for COPD, Mucinex, Coricidin with minimal relief.    Past Medical History:  Diagnosis Date   Allergy    Arthritis    COPD (chronic obstructive pulmonary disease) (HCC)    GERD (gastroesophageal reflux disease)    Hepatitis C     Patient Active Problem List   Diagnosis Date Noted   History of COPD 02/11/2023   Migraines 02/11/2023   Pelvis fracture (HCC) 04/16/2022   Closed fracture of multiple pubic rami, left, initial encounter (HCC) 04/16/2022   Chronic hepatitis C without hepatic coma (HCC) 09/10/2021    Past Surgical History:  Procedure Laterality Date   ABDOMINAL HYSTERECTOMY     BACK SURGERY     SPINE SURGERY      OB History   No obstetric history on file.      Home Medications    Prior to Admission medications   Medication Sig Start Date End Date Taking? Authorizing Provider  azelastine (ASTELIN) 0.1 % nasal spray Place 1 spray into both nostrils 2 (two) times daily. Use in each nostril as directed 03/22/24  Yes Stuart Vernell Almarie, PA-C  predniSONE  (DELTASONE ) 20 MG tablet Take 2 tablets (40 mg total) by mouth daily with breakfast. 03/22/24  Yes Stuart Vernell Almarie, PA-C  promethazine -dextromethorphan (PROMETHAZINE -DM) 6.25-15 MG/5ML syrup Take 5 mLs by mouth 4 (four) times daily as needed. 03/22/24  Yes Stuart Vernell Almarie, PA-C  albuterol  (VENTOLIN  HFA) 108 [phone] Base) MCG/ACT inhaler TAKE 2 PUFFS BY MOUTH EVERY 6 HOURS AS NEEDED FOR WHEEZE OR SHORTNESS OF BREATH 05/02/23   Del Orbe  Polanco, Iliana, FNP  fluticasone  (FLONASE ) 50 MCG/ACT nasal spray Place 2 sprays into both nostrils daily. 01/10/24   Leath-Warren, Etta PARAS, NP  nortriptyline  (PAMELOR ) 10 MG capsule TAKE 1 CAPSULE BY MOUTH AT BEDTIME. 07/11/23   Skeet, Adam R, DO  ondansetron  (ZOFRAN -ODT) 4 MG disintegrating tablet Take 1 tablet (4 mg total) by mouth every 8 (eight) hours as needed. 06/17/23   Skeet, Adam R, DO  OVER THE COUNTER MEDICATION Goody's powder, one as needed.    [provider]  OVER THE COUNTER MEDICATION Tylenol  one capsule as needed.    [provider]  OVER THE COUNTER MEDICATION Excedrine, one tablet as needed.    [provider]  pantoprazole  (PROTONIX ) 40 MG tablet TAKE 1 TABLET BY MOUTH EVERY DAY 10/12/21   Kip Ade, NP  promethazine -dextromethorphan (PROMETHAZINE -DM) 6.25-15 MG/5ML syrup Take 5 mLs by mouth 4 (four) times daily as needed. 01/10/24   Leath-Warren, Etta PARAS, NP  rizatriptan  (MAXALT ) 5 MG tablet Take 1 tablet (5 mg total) by mouth as needed for migraine. May repeat in 2 hours if needed.  Maximum 2 tablets in 24 hours. 06/17/23   Skeet, Adam R, DO  senna-docusate (SENOKOT-S) 8.6-50 MG tablet Take 1 tablet by mouth daily. 03/23/22   Leath-Warren, Etta PARAS, NP  SUMAtriptan  (IMITREX ) 25 MG tablet Take 1 tablet (25 mg total) by mouth every 12 (twelve) hours as  needed for migraine. May repeat in 2 hours if headache persists or recurs. Patient not taking: Reported on 06/17/2023 02/11/23   Del Orbe Polanco, Iliana, FNP  topiramate  (TOPAMAX ) 25 MG tablet Take 1 tablet (25 mg total) by mouth daily. Patient not taking: Reported on 06/17/2023 02/11/23   Del Orbe Polanco, Iliana, FNP  umeclidinium-vilanterol (ANORO ELLIPTA ) 62.5-25 MCG/ACT AEPB Inhale 1 puff into the lungs once daily. 02/11/23   Del Orbe Polanco, Iliana, FNP  cetirizine  (ZYRTEC  ALLERGY) 10 MG tablet Take 1 tablet (10 mg total) by mouth daily. 10/02/19 01/14/20  Avegno, Komlanvi S, FNP    Family  History Family History  Problem Relation Age of Onset   Migraines Mother    Healthy Mother    Hyperlipidemia Father    Migraines Sister    Colon cancer Maternal Grandmother    Esophageal cancer Neg Hx    Stomach cancer Neg Hx    Rectal cancer Neg Hx     Social History Social History   Tobacco Use   Smoking status: Former    Current packs/day: 0.00    Average packs/day: 1 pack/day for 15.0 years (15.0 ttl pk-yrs)    Types: Cigarettes, Cigars    Start date: 04/21/2002    Quit date: 04/21/2017    Years since quitting: 6.9   Smokeless tobacco: Former  Building Services Engineer status: Never Used  Substance Use Topics   Alcohol use: Never   Drug use: Never     Allergies   Patient has no known allergies.  Review of Systems Review of Systems PER HPI  Physical Exam Triage Vital Signs ED Triage Vitals  Encounter Vitals Group     BP 03/22/24 1146 (!) 135/90     Girls Systolic BP Percentile --      Girls Diastolic BP Percentile --      Boys Systolic BP Percentile --      Boys Diastolic BP Percentile --      Pulse Rate 03/22/24 1146 89     Resp 03/22/24 1146 20     Temp 03/22/24 1146 98.6 F (37 C)     Temp Source 03/22/24 1146 Oral     SpO2 03/22/24 1146 95 %     Weight --      Height --      Head Circumference --      Peak Flow --      Pain Score 03/22/24 1149 0     Pain Loc --      Pain Education --      Exclude from Growth Chart --    No data found.  Updated Vital Signs BP (!) 135/90 (BP Location: Right Arm)   Pulse 89   Temp 98.6 F (37 C) (Oral)   Resp 20   SpO2 95%   Visual Acuity Right Eye Distance:   Left Eye Distance:   Bilateral Distance:    Right Eye Near:   Left Eye Near:    Bilateral Near:     Physical Exam Vitals and nursing note reviewed.  Constitutional:      Appearance: Normal appearance.  HENT:     Head: Atraumatic.     Right Ear: Tympanic membrane and external ear normal.     Left Ear: Tympanic membrane and external ear normal.      Nose: Rhinorrhea present.     Mouth/Throat:     Mouth: Mucous membranes are moist.     Pharynx: Posterior oropharyngeal erythema present.  Eyes:  Extraocular Movements: Extraocular movements intact.     Conjunctiva/sclera: Conjunctivae normal.  Cardiovascular:     Rate and Rhythm: Normal rate and regular rhythm.     Heart sounds: Normal heart sounds.  Pulmonary:     Effort: Pulmonary effort is normal.     Breath sounds: No wheezing or rales.  Musculoskeletal:        General: Normal range of motion.     Cervical back: Normal range of motion and neck supple.  Skin:    General: Skin is warm and dry.  Neurological:     Mental Status: She is alert and oriented to person, place, and time.  Psychiatric:        Mood and Affect: Mood normal.        Thought Content: Thought content normal.    UC Treatments / Results  Labs (all labs ordered are listed, but only abnormal results are displayed) Labs Reviewed - No data to display  EKG   Radiology No results found.  Procedures Procedures (including critical care time)  Medications Ordered in UC Medications - No data to display  Initial Impression / Assessment and Plan / UC Course  I have reviewed the triage vital signs and the nursing notes.  Pertinent labs & imaging results that were available during my care of the patient were reviewed by me and considered in my medical decision making (see chart for details).     Vitals and exam reassuring today, suspect viral respiratory infection causing a COPD exacerbation.  Treat with prednisone , Phenergan  DM, Astelin, supportive over-the-counter medications and home care.  Return for worsening or unresolving symptoms.  Final Clinical Impressions(s) / UC Diagnoses   Final diagnoses:  Viral URI with cough  COPD exacerbation [facility])   Discharge Instructions   None    ED Prescriptions     Medication Sig Dispense Auth. Provider   predniSONE  (DELTASONE ) 20 MG tablet Take 2  tablets (40 mg total) by mouth daily with breakfast. 10 tablet Stuart Vernell Norris, PA-C   promethazine -dextromethorphan (PROMETHAZINE -DM) 6.25-15 MG/5ML syrup Take 5 mLs by mouth 4 (four) times daily as needed. 100 mL Stuart Vernell Norris, PA-C   azelastine (ASTELIN) 0.1 % nasal spray Place 1 spray into both nostrils 2 (two) times daily. Use in each nostril as directed 30 mL Stuart Vernell Norris, PA-C      PDMP not reviewed this encounter.   Lakena, [place], [place] 03/22/24 1402

## 2024-03-22 NOTE — ED Triage Notes (Signed)
 Pt reports cough, congestion, headache, ear pain x 4 days pt states she has tried Mucinex and Coricidin.
